# Patient Record
Sex: Male | Born: 1940 | Race: White | Hispanic: No | Smoking: Current every day smoker
Health system: Southern US, Community
[De-identification: ages and names within clinical notes are randomized; demographics above are authoritative.]

## PROBLEM LIST (undated history)

## (undated) DIAGNOSIS — K219 Gastro-esophageal reflux disease without esophagitis: Secondary | ICD-10-CM

## (undated) DIAGNOSIS — E039 Hypothyroidism, unspecified: Secondary | ICD-10-CM

## (undated) DIAGNOSIS — J449 Chronic obstructive pulmonary disease, unspecified: Secondary | ICD-10-CM

## (undated) HISTORY — PX: TONSILLECTOMY: SUR1361

---

## 1998-06-26 ENCOUNTER — Emergency Department (HOSPITAL_COMMUNITY): Admission: EM | Admit: 1998-06-26 | Discharge: 1998-06-26 | Payer: Self-pay | Admitting: Emergency Medicine

## 1999-05-14 ENCOUNTER — Ambulatory Visit (HOSPITAL_COMMUNITY): Admission: RE | Admit: 1999-05-14 | Discharge: 1999-05-14 | Payer: Self-pay | Admitting: Endocrinology

## 2015-12-30 DIAGNOSIS — J449 Chronic obstructive pulmonary disease, unspecified: Secondary | ICD-10-CM | POA: Diagnosis not present

## 2016-01-30 DIAGNOSIS — J449 Chronic obstructive pulmonary disease, unspecified: Secondary | ICD-10-CM | POA: Diagnosis not present

## 2016-02-27 DIAGNOSIS — J449 Chronic obstructive pulmonary disease, unspecified: Secondary | ICD-10-CM | POA: Diagnosis not present

## 2016-03-29 DIAGNOSIS — J449 Chronic obstructive pulmonary disease, unspecified: Secondary | ICD-10-CM | POA: Diagnosis not present

## 2016-04-28 DIAGNOSIS — J449 Chronic obstructive pulmonary disease, unspecified: Secondary | ICD-10-CM | POA: Diagnosis not present

## 2016-05-12 DIAGNOSIS — E789 Disorder of lipoprotein metabolism, unspecified: Secondary | ICD-10-CM | POA: Diagnosis not present

## 2016-05-12 DIAGNOSIS — E039 Hypothyroidism, unspecified: Secondary | ICD-10-CM | POA: Diagnosis not present

## 2016-05-12 DIAGNOSIS — Z125 Encounter for screening for malignant neoplasm of prostate: Secondary | ICD-10-CM | POA: Diagnosis not present

## 2016-05-12 DIAGNOSIS — Z79899 Other long term (current) drug therapy: Secondary | ICD-10-CM | POA: Diagnosis not present

## 2016-05-20 DIAGNOSIS — E789 Disorder of lipoprotein metabolism, unspecified: Secondary | ICD-10-CM | POA: Diagnosis not present

## 2016-05-20 DIAGNOSIS — E032 Hypothyroidism due to medicaments and other exogenous substances: Secondary | ICD-10-CM | POA: Diagnosis not present

## 2016-05-20 DIAGNOSIS — F17200 Nicotine dependence, unspecified, uncomplicated: Secondary | ICD-10-CM | POA: Diagnosis not present

## 2016-05-20 DIAGNOSIS — R5383 Other fatigue: Secondary | ICD-10-CM | POA: Diagnosis not present

## 2016-05-29 DIAGNOSIS — J449 Chronic obstructive pulmonary disease, unspecified: Secondary | ICD-10-CM | POA: Diagnosis not present

## 2016-06-28 DIAGNOSIS — J449 Chronic obstructive pulmonary disease, unspecified: Secondary | ICD-10-CM | POA: Diagnosis not present

## 2016-07-08 DIAGNOSIS — E039 Hypothyroidism, unspecified: Secondary | ICD-10-CM | POA: Diagnosis not present

## 2016-07-08 DIAGNOSIS — F17200 Nicotine dependence, unspecified, uncomplicated: Secondary | ICD-10-CM | POA: Diagnosis not present

## 2016-07-15 DIAGNOSIS — H2511 Age-related nuclear cataract, right eye: Secondary | ICD-10-CM | POA: Diagnosis not present

## 2016-07-15 DIAGNOSIS — H25812 Combined forms of age-related cataract, left eye: Secondary | ICD-10-CM | POA: Diagnosis not present

## 2016-07-15 DIAGNOSIS — E032 Hypothyroidism due to medicaments and other exogenous substances: Secondary | ICD-10-CM | POA: Diagnosis not present

## 2016-07-15 DIAGNOSIS — H5703 Miosis: Secondary | ICD-10-CM | POA: Diagnosis not present

## 2016-07-15 DIAGNOSIS — E789 Disorder of lipoprotein metabolism, unspecified: Secondary | ICD-10-CM | POA: Diagnosis not present

## 2016-07-29 DIAGNOSIS — J449 Chronic obstructive pulmonary disease, unspecified: Secondary | ICD-10-CM | POA: Diagnosis not present

## 2016-08-29 DIAGNOSIS — J449 Chronic obstructive pulmonary disease, unspecified: Secondary | ICD-10-CM | POA: Diagnosis not present

## 2016-09-28 DIAGNOSIS — J449 Chronic obstructive pulmonary disease, unspecified: Secondary | ICD-10-CM | POA: Diagnosis not present

## 2016-10-29 DIAGNOSIS — J449 Chronic obstructive pulmonary disease, unspecified: Secondary | ICD-10-CM | POA: Diagnosis not present

## 2016-11-28 DIAGNOSIS — J449 Chronic obstructive pulmonary disease, unspecified: Secondary | ICD-10-CM | POA: Diagnosis not present

## 2016-12-29 DIAGNOSIS — J449 Chronic obstructive pulmonary disease, unspecified: Secondary | ICD-10-CM | POA: Diagnosis not present

## 2017-01-10 DIAGNOSIS — E789 Disorder of lipoprotein metabolism, unspecified: Secondary | ICD-10-CM | POA: Diagnosis not present

## 2017-01-10 DIAGNOSIS — E039 Hypothyroidism, unspecified: Secondary | ICD-10-CM | POA: Diagnosis not present

## 2017-01-17 DIAGNOSIS — E032 Hypothyroidism due to medicaments and other exogenous substances: Secondary | ICD-10-CM | POA: Diagnosis not present

## 2017-01-17 DIAGNOSIS — R634 Abnormal weight loss: Secondary | ICD-10-CM | POA: Diagnosis not present

## 2017-01-17 DIAGNOSIS — E789 Disorder of lipoprotein metabolism, unspecified: Secondary | ICD-10-CM | POA: Diagnosis not present

## 2017-01-29 DIAGNOSIS — J449 Chronic obstructive pulmonary disease, unspecified: Secondary | ICD-10-CM | POA: Diagnosis not present

## 2017-02-26 DIAGNOSIS — J449 Chronic obstructive pulmonary disease, unspecified: Secondary | ICD-10-CM | POA: Diagnosis not present

## 2017-03-29 DIAGNOSIS — J449 Chronic obstructive pulmonary disease, unspecified: Secondary | ICD-10-CM | POA: Diagnosis not present

## 2017-04-26 DIAGNOSIS — M719 Bursopathy, unspecified: Secondary | ICD-10-CM | POA: Diagnosis not present

## 2017-04-28 DIAGNOSIS — J449 Chronic obstructive pulmonary disease, unspecified: Secondary | ICD-10-CM | POA: Diagnosis not present

## 2017-05-16 DIAGNOSIS — E789 Disorder of lipoprotein metabolism, unspecified: Secondary | ICD-10-CM | POA: Diagnosis not present

## 2017-05-16 DIAGNOSIS — E039 Hypothyroidism, unspecified: Secondary | ICD-10-CM | POA: Diagnosis not present

## 2017-05-16 DIAGNOSIS — Z125 Encounter for screening for malignant neoplasm of prostate: Secondary | ICD-10-CM | POA: Diagnosis not present

## 2017-05-16 DIAGNOSIS — Z0000 Encounter for general adult medical examination without abnormal findings: Secondary | ICD-10-CM | POA: Diagnosis not present

## 2017-05-23 DIAGNOSIS — E789 Disorder of lipoprotein metabolism, unspecified: Secondary | ICD-10-CM | POA: Diagnosis not present

## 2017-05-23 DIAGNOSIS — R828 Abnormal findings on cytological and histological examination of urine: Secondary | ICD-10-CM | POA: Diagnosis not present

## 2017-05-23 DIAGNOSIS — Z79899 Other long term (current) drug therapy: Secondary | ICD-10-CM | POA: Diagnosis not present

## 2017-05-23 DIAGNOSIS — F17200 Nicotine dependence, unspecified, uncomplicated: Secondary | ICD-10-CM | POA: Diagnosis not present

## 2017-05-25 DIAGNOSIS — R319 Hematuria, unspecified: Secondary | ICD-10-CM | POA: Diagnosis not present

## 2017-05-25 DIAGNOSIS — B349 Viral infection, unspecified: Secondary | ICD-10-CM | POA: Diagnosis not present

## 2017-05-29 DIAGNOSIS — J449 Chronic obstructive pulmonary disease, unspecified: Secondary | ICD-10-CM | POA: Diagnosis not present

## 2017-05-31 ENCOUNTER — Other Ambulatory Visit: Payer: Self-pay | Admitting: Gastroenterology

## 2017-05-31 DIAGNOSIS — Z86010 Personal history of colonic polyps: Secondary | ICD-10-CM | POA: Diagnosis not present

## 2017-05-31 DIAGNOSIS — K219 Gastro-esophageal reflux disease without esophagitis: Secondary | ICD-10-CM | POA: Diagnosis not present

## 2017-05-31 DIAGNOSIS — R079 Chest pain, unspecified: Secondary | ICD-10-CM | POA: Diagnosis not present

## 2017-05-31 DIAGNOSIS — R634 Abnormal weight loss: Secondary | ICD-10-CM | POA: Diagnosis not present

## 2017-06-28 DIAGNOSIS — J449 Chronic obstructive pulmonary disease, unspecified: Secondary | ICD-10-CM | POA: Diagnosis not present

## 2017-07-04 DIAGNOSIS — E039 Hypothyroidism, unspecified: Secondary | ICD-10-CM | POA: Diagnosis not present

## 2017-07-28 ENCOUNTER — Encounter (HOSPITAL_COMMUNITY)
Admission: RE | Disposition: A | Discharge status: Home / Self Care | Payer: Self-pay | Source: Ambulatory Visit | Attending: Gastroenterology

## 2017-07-28 ENCOUNTER — Ambulatory Visit (HOSPITAL_COMMUNITY): Payer: PPO | Admitting: Anesthesiology

## 2017-07-28 ENCOUNTER — Encounter (HOSPITAL_COMMUNITY): Payer: Self-pay | Admitting: *Deleted

## 2017-07-28 ENCOUNTER — Ambulatory Visit (HOSPITAL_COMMUNITY)
Admission: RE | Admit: 2017-07-28 | Discharge: 2017-07-28 | Disposition: A | Discharge status: Home / Self Care | Payer: PPO | Source: Ambulatory Visit | Attending: Gastroenterology | Admitting: Gastroenterology

## 2017-07-28 DIAGNOSIS — K2950 Unspecified chronic gastritis without bleeding: Secondary | ICD-10-CM | POA: Insufficient documentation

## 2017-07-28 DIAGNOSIS — K3189 Other diseases of stomach and duodenum: Secondary | ICD-10-CM | POA: Insufficient documentation

## 2017-07-28 DIAGNOSIS — Z86010 Personal history of colonic polyps: Secondary | ICD-10-CM | POA: Diagnosis not present

## 2017-07-28 DIAGNOSIS — Z9889 Other specified postprocedural states: Secondary | ICD-10-CM | POA: Diagnosis not present

## 2017-07-28 DIAGNOSIS — J449 Chronic obstructive pulmonary disease, unspecified: Secondary | ICD-10-CM | POA: Insufficient documentation

## 2017-07-28 DIAGNOSIS — Z1211 Encounter for screening for malignant neoplasm of colon: Secondary | ICD-10-CM | POA: Insufficient documentation

## 2017-07-28 DIAGNOSIS — K621 Rectal polyp: Secondary | ICD-10-CM | POA: Diagnosis not present

## 2017-07-28 DIAGNOSIS — D123 Benign neoplasm of transverse colon: Secondary | ICD-10-CM | POA: Diagnosis not present

## 2017-07-28 DIAGNOSIS — K219 Gastro-esophageal reflux disease without esophagitis: Secondary | ICD-10-CM | POA: Diagnosis not present

## 2017-07-28 DIAGNOSIS — K635 Polyp of colon: Secondary | ICD-10-CM | POA: Diagnosis not present

## 2017-07-28 DIAGNOSIS — E039 Hypothyroidism, unspecified: Secondary | ICD-10-CM | POA: Diagnosis not present

## 2017-07-28 DIAGNOSIS — F17210 Nicotine dependence, cigarettes, uncomplicated: Secondary | ICD-10-CM | POA: Insufficient documentation

## 2017-07-28 DIAGNOSIS — D124 Benign neoplasm of descending colon: Secondary | ICD-10-CM | POA: Diagnosis not present

## 2017-07-28 DIAGNOSIS — B9681 Helicobacter pylori [H. pylori] as the cause of diseases classified elsewhere: Secondary | ICD-10-CM | POA: Diagnosis not present

## 2017-07-28 DIAGNOSIS — K2970 Gastritis, unspecified, without bleeding: Secondary | ICD-10-CM | POA: Diagnosis not present

## 2017-07-28 HISTORY — PX: COLONOSCOPY WITH PROPOFOL: SHX5780

## 2017-07-28 HISTORY — DX: Gastro-esophageal reflux disease without esophagitis: K21.9

## 2017-07-28 HISTORY — PX: ESOPHAGOGASTRODUODENOSCOPY (EGD) WITH PROPOFOL: SHX5813

## 2017-07-28 HISTORY — DX: Chronic obstructive pulmonary disease, unspecified: J44.9

## 2017-07-28 HISTORY — DX: Hypothyroidism, unspecified: E03.9

## 2017-07-28 SURGERY — ESOPHAGOGASTRODUODENOSCOPY (EGD) WITH PROPOFOL
Anesthesia: Monitor Anesthesia Care

## 2017-07-28 MED ORDER — PROPOFOL 10 MG/ML IV BOLUS
INTRAVENOUS | Status: DC | PRN
  Administered 2017-07-28 (×17): 20 mg via INTRAVENOUS

## 2017-07-28 MED ORDER — PROPOFOL 10 MG/ML IV BOLUS
INTRAVENOUS | Status: AC
  Filled 2017-07-28: qty 40

## 2017-07-28 MED ORDER — LACTATED RINGERS IV SOLN
INTRAVENOUS | Status: DC
  Administered 2017-07-28: 09:00:00 via INTRAVENOUS

## 2017-07-28 MED ORDER — SODIUM CHLORIDE 0.9 % IV SOLN: INTRAVENOUS | Status: DC

## 2017-07-28 SURGICAL SUPPLY — 25 items

## 2017-07-28 NOTE — Op Note (Signed)
[facility] Patient Name: Edward Matthews Procedure Date: 07/28/2017 MRN: [id number] Attending MD: Carol Ada , MD Date of Birth: [birth year] CSN: [id number] Age: 76 Admit Type: Outpatient Procedure:                Colonoscopy Indications:              High risk colon cancer surveillance: Personal                            history of colonic polyps Providers:                Carol Ada, MD, Laverta Baltimore RN, RN, Alan Mulder, Technician Referring MD:              Medicines:                Propofol per Anesthesia Complications:            No immediate complications. Estimated Blood Loss:     Estimated blood loss was minimal. Procedure:                Pre-Anesthesia Assessment:                           - Prior to the procedure, a History and Physical                            was performed, and patient medications and                            allergies were reviewed. The patient's tolerance of                            previous anesthesia was also reviewed. The risks                            and benefits of the procedure and the sedation                            options and risks were discussed with the patient.                            All questions were answered, and informed consent                            was obtained. Prior Anticoagulants: The patient has                            taken no previous anticoagulant or antiplatelet                            agents. ASA Grade Assessment: III - A patient with  severe systemic disease. After reviewing the risks                            and benefits, the patient was deemed in                            satisfactory condition to undergo the procedure.                           - Sedation was administered by an anesthesia                            professional. Deep sedation was attained.                           After obtaining informed  consent, the colonoscope                            was passed under direct vision. Throughout the                            procedure, the patient's blood pressure, pulse, and                            oxygen saturations were monitored continuously. The                            EC-[id number] ([id number]) scope was introduced through                            the anus and advanced to the the cecum, identified                            by appendiceal orifice and ileocecal valve. The                            colonoscopy was somewhat difficult due to                            significant looping. Successful completion of the                            procedure was aided by applying abdominal pressure.                            The patient tolerated the procedure well. The                            quality of the bowel preparation was good. The                            ileocecal valve, appendiceal orifice, and rectum  were photographed. Scope In: 9:12:29 AM Scope Out: 9:40:24 AM Scope Withdrawal Time: 0 hours 12 minutes 34 seconds  Total Procedure Duration: 0 hours 27 minutes 55 seconds  Findings:      Two sessile polyps were found in the rectum and transverse colon. The       polyps were 7 to 10 mm in size. These polyps were removed with a hot       snare. Resection and retrieval were complete.      Three sessile polyps were found in the descending colon. The polyps were       3 to 4 mm in size. These polyps were removed with a cold snare.       Resection and retrieval were complete.      Technical issues precluded images of the cecum and a couple of other       polyps. Impression:               - Two 7 to 10 mm polyps in the rectum and in the                            transverse colon, removed with a hot snare.                            Resected and retrieved.                           - Three 3 to 4 mm polyps in the descending colon,                             removed with a cold snare. Resected and retrieved. Moderate Sedation:      N/A- Per Anesthesia Care Recommendation:           - Patient has a contact number available for                            emergencies. The signs and symptoms of potential                            delayed complications were discussed with the                            patient. Return to normal activities tomorrow.                            Written discharge instructions were provided to the                            patient.                           - Resume previous diet.                           - Continue present medications.                           - Await pathology results.                           -  Repeat colonoscopy in 3 years for surveillance. Procedure Code(s):        --- Professional ---                           [phone], Colonoscopy, flexible; with removal of                            tumor(s), polyp(s), or other lesion(s) by snare                            technique Diagnosis Code(s):        --- Professional ---                           K62.1, Rectal polyp                           Z86.010, Personal history of colonic polyps                           D12.3, Benign neoplasm of transverse colon (hepatic                            flexure or splenic flexure)                           D12.4, Benign neoplasm of descending colon CPT copyright 2016 [employer]. All rights reserved. The codes documented in this report are preliminary and upon coder review may  be revised to meet current compliance requirements. Carol Ada, MD Carol Ada, MD 07/28/2017 9:50:49 AM This report has been signed electronically. Number of Addenda: 0

## 2017-07-28 NOTE — Op Note (Signed)
[facility] Patient Name: Edward Matthews Procedure Date: 07/28/2017 MRN: [id number] Attending MD: Carol Ada , MD Date of Birth: 07-19-1941 CSN: [id number] Age: 76 Admit Type: Outpatient Procedure:                Upper GI endoscopy Indications:              Heartburn Providers:                Carol Ada, MD, Laverta Baltimore RN, RN, Alan Mulder, Technician Referring MD:              Medicines:                Propofol per Anesthesia Complications:            No immediate complications. Estimated Blood Loss:     Estimated blood loss: none. Estimated blood loss                            was minimal. Procedure:                Pre-Anesthesia Assessment:                           - Prior to the procedure, a History and Physical                            was performed, and patient medications and                            allergies were reviewed. The patient's tolerance of                            previous anesthesia was also reviewed. The risks                            and benefits of the procedure and the sedation                            options and risks were discussed with the patient.                            All questions were answered, and informed consent                            was obtained. Prior Anticoagulants: The patient has                            taken no previous anticoagulant or antiplatelet                            agents. ASA Grade Assessment: III - A patient with                            severe systemic disease.  After reviewing the risks                            and benefits, the patient was deemed in                            satisfactory condition to undergo the procedure.                           - Sedation was administered by an anesthesia                            professional. Deep sedation was attained.                           After obtaining informed consent, the endoscope was                       passed under direct vision. Throughout the                            procedure, the patient's blood pressure, pulse, and                            oxygen saturations were monitored continuously. The                            EC-3490LI ([id number]) scope was introduced through                            the mouth, and advanced to the second part of                            duodenum. The upper GI endoscopy was accomplished                            without difficulty. The patient tolerated the                            procedure well. Scope In: Scope Out: Findings:      The esophagus was normal.      Diffuse nodular mucosa was found in the gastric antrum. ? Intestinal       metaplasia. Biopsies were taken with a cold forceps for histology.      The examined duodenum was normal. Impression:               - Normal esophagus.                           - Nodular mucosa in the gastric antrum. Biopsied.                           - Normal examined duodenum. Moderate Sedation:      N/A- Per Anesthesia Care Recommendation:           - Patient has a contact number available for  emergencies. The signs and symptoms of potential                            delayed complications were discussed with the                            patient. Return to normal activities tomorrow.                            Written discharge instructions were provided to the                            patient.                           - Resume previous diet.                           - Continue present medications.                           - Await pathology results. Procedure Code(s):        --- Professional ---                           [id number], Esophagogastroduodenoscopy, flexible,                            transoral; with biopsy, single or multiple Diagnosis Code(s):        --- Professional ---                           K31.89, Other diseases of stomach and duodenum                            R12, Heartburn CPT copyright 2016 American [employer] Association. All rights reserved. The codes documented in this report are preliminary and upon coder review may  be revised to meet current compliance requirements. Carol Ada, MD Carol Ada, MD 07/28/2017 9:53:26 AM This report has been signed electronically. Number of Addenda: 0

## 2017-07-28 NOTE — H&P (Signed)
  Edward Matthews HPI: His colonoscopy on 04/07/2011 was significant for several large adenomas, measuring 1 cm. A 3 year follow up was recommended, but he did not follow up. The patient reports issues with reflux and chest pressure when he lies down, but in the daytime he will also have some problems. He does state that intermittently, but no often, he will have some chest pressure with walking up a flight of stairs. No reports of melena, but he states that he has hemorrhoidal bleeding intermittently. Every evening he uses oxygen and he is trying to quit. His wife is in hospice currently with lung cancer. Recently he was told by Dr. Wilson Singer that his weight dropped down by 19 lbs.   Past Medical History:  Diagnosis Date  . COPD (chronic obstructive pulmonary disease) ([place])   . GERD (gastroesophageal reflux disease)   . Hypothyroidism     Past Surgical History:  Procedure Laterality Date  . TONSILLECTOMY      History reviewed. No pertinent family history.  Social History:  reports that he has been smoking Cigarettes.  He has a 31.50 pack-year smoking history. He has never used smokeless tobacco. His alcohol and drug histories are not on file.  Allergies: No Known Allergies  Medications:  Scheduled:  Continuous: . lactated ringers 20 mL/hr at 07/28/17 0831    No results found for this or any previous visit (from the past 24 hour(s)).   No results found.  ROS:  As stated above in the HPI otherwise negative.  Blood pressure (!) 179/59, pulse (!) 52, temperature 97.6 F (36.4 C), temperature source Oral, resp. rate (!) 21, height 5\' 9"  (1.753 m), weight 59.9 kg (132 lb), SpO2 94 %.    PE: Gen: NAD, Alert and Oriented HEENT:  [place]/AT, EOMI Neck: Supple, no LAD Lungs: CTA Bilaterally CV: RRR without M/G/R ABM: Soft, NTND, +BS Ext: No C/C/E  Assessment/Plan: 1) Personal history of polyps - Repeat colonoscopy. 2) GERD.   Mollye Guinta D 07/28/2017, 8:50 AM

## 2017-07-28 NOTE — Anesthesia Postprocedure Evaluation (Signed)
Anesthesia Post Note  Patient: Edward Matthews  Procedure(s) Performed: Procedure(s) (LRB): ESOPHAGOGASTRODUODENOSCOPY (EGD) WITH PROPOFOL (N/A) COLONOSCOPY WITH PROPOFOL (N/A)     Patient location during evaluation: Endoscopy Anesthesia Type: MAC Level of consciousness: awake and alert Pain management: pain level controlled Vital Signs Assessment: post-procedure vital signs reviewed and stable Respiratory status: spontaneous breathing, nonlabored ventilation, respiratory function stable and patient connected to nasal cannula oxygen Cardiovascular status: stable and blood pressure returned to baseline Anesthetic complications: no    Last Vitals:  Vitals:   07/28/17 0954 07/28/17 1001  BP: 112/60 (!) 164/62  Pulse: 69 (!) 50  Resp: 15 17  Temp:      Last Pain:  Vitals:   07/28/17 0827  TempSrc: Oral                 [name]

## 2017-07-28 NOTE — Anesthesia Preprocedure Evaluation (Signed)
Anesthesia Evaluation  Patient identified by MRN, date of birth, ID band Patient awake    Reviewed: Allergy & Precautions, NPO status , Patient's Chart, lab work & pertinent test results  Airway Mallampati: II  TM Distance: >3 FB Neck ROM: Full    Dental no notable dental hx.    Pulmonary COPD, Current Smoker,    Pulmonary exam normal breath sounds clear to auscultation       Cardiovascular negative cardio ROS Normal cardiovascular exam Rhythm:Regular Rate:Normal     Neuro/Psych negative neurological ROS  negative psych ROS   GI/Hepatic negative GI ROS, Neg liver ROS,   Endo/Other  Hypothyroidism   Renal/GU negative Renal ROS  negative genitourinary   Musculoskeletal negative musculoskeletal ROS (+)   Abdominal   Peds negative pediatric ROS (+)  Hematology negative hematology ROS (+)   Anesthesia Other Findings   Reproductive/Obstetrics negative OB ROS                             Anesthesia Physical Anesthesia Plan  ASA: III  Anesthesia Plan: MAC   Post-op Pain Management:    Induction:   PONV Risk Score and Plan:   Airway Management Planned: Nasal Cannula  Additional Equipment:   Intra-op Plan:   Post-operative Plan:   Informed Consent: I have reviewed the patients History and Physical, chart, labs and discussed the procedure including the risks, benefits and alternatives for the proposed anesthesia with the patient or authorized representative who has indicated his/her understanding and acceptance.   Dental advisory given  Plan Discussed with: CRNA  Anesthesia Plan Comments:         Anesthesia Quick Evaluation

## 2017-07-28 NOTE — Transfer of Care (Signed)
Immediate Anesthesia Transfer of Care Note  Patient: Deverick Pruss Aikens  Procedure(s) Performed: Procedure(s): ESOPHAGOGASTRODUODENOSCOPY (EGD) WITH PROPOFOL (N/A) COLONOSCOPY WITH PROPOFOL (N/A)  Patient Location: PACU  Anesthesia Type:MAC  Level of Consciousness: sedated  Airway & Oxygen Therapy: Patient Spontanous Breathing and Patient connected to nasal cannula oxygen  Post-op Assessment: Report given to RN and Post -op Vital signs reviewed and stable  Post vital signs: Reviewed and stable  Last Vitals:  Vitals:   07/28/17 0827  BP: (!) 179/59  Pulse: (!) 52  Resp: (!) 21  Temp: 36.4 C    Last Pain:  Vitals:   07/28/17 0827  TempSrc: Oral         Complications: No apparent anesthesia complications

## 2017-07-28 NOTE — Discharge Instructions (Signed)
Esophagogastroduodenoscopy, Care After °Refer to this sheet in the next few weeks. These instructions provide you with information about caring for yourself after your procedure. Your health care provider may also give you more specific instructions. Your treatment has been planned according to current medical practices, but problems sometimes occur. Call your health care provider if you have any problems or questions after your procedure. °What can I expect after the procedure? °After the procedure, it is common to have: °· A sore throat. °· Nausea. °· Bloating. °· Dizziness. °· Fatigue. ° °Follow these instructions at home: °· Do not eat or drink anything until the numbing medicine (local anesthetic) has worn off and your gag reflex has returned. You will know that the local anesthetic has worn off when you can swallow comfortably. °· Do not drive for 24 hours if you received a medicine to help you relax (sedative). °· If your health care provider took a tissue sample for testing during the procedure, make sure to get your test results. This is your responsibility. Ask your health care provider or the department performing the test when your results will be ready. °· Keep all follow-up visits as told by your health care provider. This is important. °Contact a health care provider if: °· You cannot stop coughing. °· You are not urinating. °· You are urinating less than usual. °Get help right away if: °· You have trouble swallowing. °· You cannot eat or drink. °· You have throat or chest pain that gets worse. °· You are dizzy or light-headed. °· You faint. °· You have nausea or vomiting. °· You have chills. °· You have a fever. °· You have severe abdominal pain. °· You have black, tarry, or bloody stools. °This information is not intended to replace advice given to you by your health care provider. Make sure you discuss any questions you have with your health care provider. °Document Released: 11/28/2012 Document  Revised: 05/19/2016 Document Reviewed: 11/05/2015 °Elsevier Interactive Patient Education © 2018 [employer] °Colonoscopy, Adult, Care After °This sheet gives you information about how to care for yourself after your procedure. Your doctor may also give you more specific instructions. If you have problems or questions, call your doctor. °Follow these instructions at home: °General instructions ° °· For the first 24 hours after the procedure: °? Do not drive or use machinery. °? Do not sign important documents. °? Do not drink alcohol. °? Do your daily activities more slowly than normal. °? Eat foods that are soft and easy to digest. °? Rest often. °· Take over-the-counter or prescription medicines only as told by your doctor. °· It is up to you to get the results of your procedure. Ask your doctor, or the department performing the procedure, when your results will be ready. °To help cramping and bloating: °· Try walking around. °· Put heat on your belly (abdomen) as told by your doctor. Use a heat source that your doctor recommends, such as a moist heat pack or a heating pad. °? Put a towel between your skin and the heat source. °? Leave the heat on for 20-30 minutes. °? Remove the heat if your skin turns bright red. This is especially important if you cannot feel pain, heat, or cold. You can get burned. °Eating and drinking °· Drink enough fluid to keep your pee (urine) clear or pale yellow. °· Return to your normal diet as told by your doctor. Avoid heavy or fried foods that are hard to digest. °· Avoid   drinking alcohol for as long as told by your doctor. °Contact a doctor if: °· You have blood in your poop (stool) 2-3 days after the procedure. °Get help right away if: °· You have more than a small amount of blood in your poop. °· You see large clumps of tissue (blood clots) in your poop. °· Your belly is swollen. °· You feel sick to your stomach (nauseous). °· You throw up (vomit). °· You have a fever. °· You  have belly pain that gets worse, and medicine does not help your pain. °This information is not intended to replace advice given to you by your health care provider. Make sure you discuss any questions you have with your health care provider. °Document Released: 01/14/2011 Document Revised: 09/05/2016 Document Reviewed: 09/05/2016 °Elsevier Interactive Patient Education © 2017 [employer] ° °

## 2017-07-29 DIAGNOSIS — J449 Chronic obstructive pulmonary disease, unspecified: Secondary | ICD-10-CM | POA: Diagnosis not present

## 2017-08-29 DIAGNOSIS — J449 Chronic obstructive pulmonary disease, unspecified: Secondary | ICD-10-CM | POA: Diagnosis not present

## 2017-09-11 DIAGNOSIS — A048 Other specified bacterial intestinal infections: Secondary | ICD-10-CM | POA: Diagnosis not present

## 2017-09-11 DIAGNOSIS — Z86010 Personal history of colonic polyps: Secondary | ICD-10-CM | POA: Diagnosis not present

## 2017-09-28 DIAGNOSIS — J449 Chronic obstructive pulmonary disease, unspecified: Secondary | ICD-10-CM | POA: Diagnosis not present

## 2017-10-29 DIAGNOSIS — J449 Chronic obstructive pulmonary disease, unspecified: Secondary | ICD-10-CM | POA: Diagnosis not present

## 2017-11-28 DIAGNOSIS — J449 Chronic obstructive pulmonary disease, unspecified: Secondary | ICD-10-CM | POA: Diagnosis not present

## 2017-11-30 DIAGNOSIS — E039 Hypothyroidism, unspecified: Secondary | ICD-10-CM | POA: Diagnosis not present

## 2017-11-30 DIAGNOSIS — E7800 Pure hypercholesterolemia, unspecified: Secondary | ICD-10-CM | POA: Diagnosis not present

## 2017-11-30 DIAGNOSIS — J449 Chronic obstructive pulmonary disease, unspecified: Secondary | ICD-10-CM | POA: Diagnosis not present

## 2017-11-30 DIAGNOSIS — R634 Abnormal weight loss: Secondary | ICD-10-CM | POA: Diagnosis not present

## 2017-11-30 DIAGNOSIS — Z720 Tobacco use: Secondary | ICD-10-CM | POA: Diagnosis not present

## 2017-12-11 DIAGNOSIS — E7800 Pure hypercholesterolemia, unspecified: Secondary | ICD-10-CM | POA: Diagnosis not present

## 2017-12-11 DIAGNOSIS — E039 Hypothyroidism, unspecified: Secondary | ICD-10-CM | POA: Diagnosis not present

## 2017-12-29 DIAGNOSIS — J449 Chronic obstructive pulmonary disease, unspecified: Secondary | ICD-10-CM | POA: Diagnosis not present

## 2018-01-10 DIAGNOSIS — J849 Interstitial pulmonary disease, unspecified: Secondary | ICD-10-CM | POA: Diagnosis not present

## 2018-01-10 DIAGNOSIS — K219 Gastro-esophageal reflux disease without esophagitis: Secondary | ICD-10-CM | POA: Diagnosis not present

## 2018-01-10 DIAGNOSIS — F17200 Nicotine dependence, unspecified, uncomplicated: Secondary | ICD-10-CM | POA: Diagnosis not present

## 2018-01-10 DIAGNOSIS — Z0000 Encounter for general adult medical examination without abnormal findings: Secondary | ICD-10-CM | POA: Diagnosis not present

## 2018-01-10 DIAGNOSIS — E7800 Pure hypercholesterolemia, unspecified: Secondary | ICD-10-CM | POA: Diagnosis not present

## 2018-01-10 DIAGNOSIS — E039 Hypothyroidism, unspecified: Secondary | ICD-10-CM | POA: Diagnosis not present

## 2018-01-10 DIAGNOSIS — J449 Chronic obstructive pulmonary disease, unspecified: Secondary | ICD-10-CM | POA: Diagnosis not present

## 2018-01-17 ENCOUNTER — Observation Stay (HOSPITAL_COMMUNITY)
Admission: EM | Admit: 2018-01-17 | Discharge: 2018-01-18 | Disposition: A | Discharge status: Home / Self Care | Payer: PPO | Attending: Internal Medicine | Admitting: Internal Medicine

## 2018-01-17 ENCOUNTER — Emergency Department (HOSPITAL_COMMUNITY): Payer: PPO

## 2018-01-17 ENCOUNTER — Encounter (HOSPITAL_COMMUNITY): Payer: Self-pay | Admitting: Emergency Medicine

## 2018-01-17 ENCOUNTER — Other Ambulatory Visit: Payer: Self-pay

## 2018-01-17 DIAGNOSIS — F17210 Nicotine dependence, cigarettes, uncomplicated: Secondary | ICD-10-CM | POA: Diagnosis not present

## 2018-01-17 DIAGNOSIS — E7800 Pure hypercholesterolemia, unspecified: Secondary | ICD-10-CM | POA: Insufficient documentation

## 2018-01-17 DIAGNOSIS — H538 Other visual disturbances: Secondary | ICD-10-CM | POA: Diagnosis not present

## 2018-01-17 DIAGNOSIS — J449 Chronic obstructive pulmonary disease, unspecified: Secondary | ICD-10-CM | POA: Diagnosis not present

## 2018-01-17 DIAGNOSIS — E039 Hypothyroidism, unspecified: Secondary | ICD-10-CM | POA: Diagnosis not present

## 2018-01-17 DIAGNOSIS — K219 Gastro-esophageal reflux disease without esophagitis: Secondary | ICD-10-CM | POA: Diagnosis not present

## 2018-01-17 DIAGNOSIS — J9611 Chronic respiratory failure with hypoxia: Secondary | ICD-10-CM | POA: Diagnosis present

## 2018-01-17 DIAGNOSIS — R079 Chest pain, unspecified: Secondary | ICD-10-CM | POA: Diagnosis not present

## 2018-01-17 DIAGNOSIS — G9389 Other specified disorders of brain: Secondary | ICD-10-CM | POA: Diagnosis not present

## 2018-01-17 DIAGNOSIS — R072 Precordial pain: Principal | ICD-10-CM | POA: Insufficient documentation

## 2018-01-17 DIAGNOSIS — J439 Emphysema, unspecified: Secondary | ICD-10-CM | POA: Diagnosis not present

## 2018-01-17 DIAGNOSIS — Z720 Tobacco use: Secondary | ICD-10-CM

## 2018-01-17 LAB — CBC
HEMATOCRIT: 43 % (ref 39.0–52.0)
HEMOGLOBIN: 14.4 g/dL (ref 13.0–17.0)
MCH: 31 pg (ref 26.0–34.0)
MCHC: 33.5 g/dL (ref 30.0–36.0)
MCV: 92.7 fL (ref 78.0–100.0)
Platelets: 179 10*3/uL (ref 150–400)
RBC: 4.64 MIL/uL (ref 4.22–5.81)
RDW: 13.4 % (ref 11.5–15.5)
WBC: 9.5 10*3/uL (ref 4.0–10.5)

## 2018-01-17 LAB — URINALYSIS, ROUTINE W REFLEX MICROSCOPIC
BACTERIA UA: NONE SEEN
Bilirubin Urine: NEGATIVE
Glucose, UA: NEGATIVE mg/dL
KETONES UR: 20 mg/dL — AB
Leukocytes, UA: NEGATIVE
Nitrite: NEGATIVE
PH: 7 (ref 5.0–8.0)
Protein, ur: NEGATIVE mg/dL
SQUAMOUS EPITHELIAL / LPF: NONE SEEN
Specific Gravity, Urine: 1.009 (ref 1.005–1.030)

## 2018-01-17 LAB — HEPATIC FUNCTION PANEL
ALT: 14 U/L — AB (ref 17–63)
AST: 16 U/L (ref 15–41)
Albumin: 3.7 g/dL (ref 3.5–5.0)
Alkaline Phosphatase: 93 U/L (ref 38–126)
BILIRUBIN DIRECT: 0.2 mg/dL (ref 0.1–0.5)
Indirect Bilirubin: 0.4 mg/dL (ref 0.3–0.9)
TOTAL PROTEIN: 6.8 g/dL (ref 6.5–8.1)
Total Bilirubin: 0.6 mg/dL (ref 0.3–1.2)

## 2018-01-17 LAB — BASIC METABOLIC PANEL
Anion gap: 11 (ref 5–15)
BUN: 13 mg/dL (ref 6–20)
CHLORIDE: 107 mmol/L (ref 101–111)
CO2: 22 mmol/L (ref 22–32)
Calcium: 8.9 mg/dL (ref 8.9–10.3)
Creatinine, Ser: 0.95 mg/dL (ref 0.61–1.24)
GFR calc Af Amer: >60 mL/min (ref >60)
GFR calc non Af Amer: >60 mL/min (ref >60)
GLUCOSE: 105 mg/dL — AB (ref 65–99)
POTASSIUM: 3.9 mmol/L (ref 3.5–5.1)
Sodium: 140 mmol/L (ref 135–145)

## 2018-01-17 LAB — I-STAT TROPONIN, ED: TROPONIN I, POC: 0 ng/mL (ref 0.00–0.08)

## 2018-01-17 LAB — TSH: TSH: 0.511 u[IU]/mL (ref 0.350–4.500)

## 2018-01-17 LAB — LIPASE, BLOOD: LIPASE: 22 U/L (ref 11–51)

## 2018-01-17 LAB — TROPONIN I: Troponin I: <0.03 ng/mL (ref <0.03)

## 2018-01-17 LAB — MAGNESIUM: Magnesium: 2 mg/dL (ref 1.7–2.4)

## 2018-01-17 MED ORDER — FLUTICASONE FUROATE-VILANTEROL 200-25 MCG/INH IN AEPB
1.0000 | INHALATION_SPRAY | Freq: Every day | RESPIRATORY_TRACT | Status: DC
Start: 2018-01-18 — End: 2018-01-18
  Filled 2018-01-17: qty 28

## 2018-01-17 MED ORDER — NICOTINE 21 MG/24HR TD PT24
21.0000 mg | MEDICATED_PATCH | Freq: Every day | TRANSDERMAL | Status: DC
  Administered 2018-01-18: 21 mg via TRANSDERMAL
  Filled 2018-01-17: qty 1

## 2018-01-17 MED ORDER — ENOXAPARIN SODIUM 40 MG/0.4ML ~~LOC~~ SOLN
40.0000 mg | SUBCUTANEOUS | Status: DC
  Administered 2018-01-18: 40 mg via SUBCUTANEOUS
  Filled 2018-01-17 (×2): qty 0.4

## 2018-01-17 MED ORDER — ACETAMINOPHEN 325 MG PO TABS: 650.0000 mg | ORAL_TABLET | ORAL | Status: DC | PRN

## 2018-01-17 MED ORDER — PRAVASTATIN SODIUM 40 MG PO TABS
80.0000 mg | ORAL_TABLET | Freq: Every day | ORAL | Status: DC
  Administered 2018-01-18: 80 mg via ORAL
  Filled 2018-01-17: qty 2

## 2018-01-17 MED ORDER — PANTOPRAZOLE SODIUM 40 MG PO TBEC
40.0000 mg | DELAYED_RELEASE_TABLET | Freq: Every day | ORAL | Status: DC
  Administered 2018-01-18: 40 mg via ORAL
  Filled 2018-01-17: qty 1

## 2018-01-17 MED ORDER — SODIUM CHLORIDE 0.9 % IV BOLUS (SEPSIS)
500.0000 mL | Freq: Once | INTRAVENOUS | Status: AC
  Administered 2018-01-17: 500 mL via INTRAVENOUS

## 2018-01-17 MED ORDER — LEVOTHYROXINE SODIUM 125 MCG PO TABS: 125.0000 ug | ORAL_TABLET | Freq: Every day | ORAL | Status: DC

## 2018-01-17 MED ORDER — ONDANSETRON HCL 4 MG/2ML IJ SOLN: 4.0000 mg | Freq: Four times a day (QID) | INTRAMUSCULAR | Status: DC | PRN

## 2018-01-17 NOTE — ED Notes (Signed)
Patient transported to CT 

## 2018-01-17 NOTE — H&P (Signed)
History and Physical    Edward Matthews HEN:[id number] DOB: 1941-09-06 DOA: 01/17/2018  [name], MD  Patient coming from: Home  I have personally briefly reviewed patient's old medical records in [place]  Chief Complaint: CP, SOB, weakness  HPI: Edward Matthews is a 77 y.o. male with medical history significant of COPD emphysema, GERD.  Patient is still smoking 1 PPD.  Is on O2 at night for the past 1.5-2 years.  Presents to the ED with onset last evening was much worse with epigastric / substernal chest pain.  Diarrhea and intermittent blurry vision.  Last diarrhea was this AM.  Patient has been having intermittent CP for months, but reports that its really the severe SOB with any activity that gets to him.   ED Course: Trop neg.  EKG with possible ST depression in lead III.  Patient is satting 87% sitting on room air.  EDP thinks this could be coronary and wants CP r/o.   Review of Systems: As per HPI otherwise 10 point review of systems negative.   Past Medical History:  Diagnosis Date  . COPD (chronic obstructive pulmonary disease) ([place])   . GERD (gastroesophageal reflux disease)   . Hypothyroidism     Past Surgical History:  Procedure Laterality Date  . COLONOSCOPY WITH PROPOFOL N/A 07/28/2017   Procedure: COLONOSCOPY WITH PROPOFOL;  Surgeon: Carol Ada, MD;  Location: [facility];  Service: Endoscopy;  Laterality: N/A;  . ESOPHAGOGASTRODUODENOSCOPY (EGD) WITH PROPOFOL N/A 07/28/2017   Procedure: ESOPHAGOGASTRODUODENOSCOPY (EGD) WITH PROPOFOL;  Surgeon: Carol Ada, MD;  Location: [facility];  Service: Endoscopy;  Laterality: N/A;  . TONSILLECTOMY       reports that he has been smoking cigarettes.  He has a 31.50 pack-year smoking history. he has never used smokeless tobacco. His alcohol and drug histories are not on file.  No Known Allergies  No family history on file.   Prior to Admission medications   Medication Sig Start Date End  Date Taking? Authorizing Provider  ADVAIR DISKUS 250-50 MCG/DOSE AEPB Inhale 2 puffs into the lungs 2 (two) times daily.  05/30/17  Yes [provider]  dexlansoprazole (DEXILANT) 60 MG capsule Take 60 mg by mouth daily.   Yes [provider]  pravastatin (PRAVACHOL) 80 MG tablet Take 80 mg by mouth daily.   Yes [provider]  SYNTHROID 125 MCG tablet Take 125 mcg by mouth daily before breakfast.  05/25/17  Yes [provider]    Physical Exam: Vitals:   01/17/18 2015 01/17/18 2030 01/17/18 2145 01/17/18 2200  BP: (!) 145/94 (!) 163/77 (!) 154/76 (!) 175/94  Pulse: 71 65 (!) 58 63  Resp: 16 (!) 25 18 18   Temp:      TempSrc:      SpO2: 98% (!) 87% 92% 92%  Weight:      Height:        Constitutional: NAD, calm, comfortable Eyes: PERRL, lids and conjunctivae normal ENMT: Mucous membranes are moist. Posterior pharynx clear of any exudate or lesions.Normal dentition.  Neck: normal, supple, no masses, no thyromegaly Respiratory: clear to auscultation bilaterally, no wheezing, no crackles. Normal respiratory effort. No accessory muscle use.  Cardiovascular: Regular rate and rhythm, no murmurs / rubs / gallops. No extremity edema. 2+ pedal pulses. No carotid bruits.  Abdomen: no tenderness, no masses palpated. No hepatosplenomegaly. Bowel sounds positive.  Musculoskeletal: no clubbing / cyanosis. No joint deformity upper and lower extremities. Good ROM, no contractures. Normal muscle  tone.  Skin: no rashes, lesions, ulcers. No induration Neurologic: CN 2-12 grossly intact. Sensation intact, DTR normal. Strength 5/5 in all 4.  Psychiatric: Normal judgment and insight. Alert and oriented x 3. Normal mood.    Labs on Admission: I have personally reviewed following labs and imaging studies  CBC: Recent Labs  Lab 01/17/18 1758  WBC 9.5  HGB 14.4  HCT 43.0  MCV 92.7  PLT 161   Basic Metabolic Panel: Recent Labs  Lab 01/17/18 1758  NA 140  K 3.9    CL 107  CO2 22  GLUCOSE 105*  BUN 13  CREATININE 0.95  CALCIUM 8.9  MG 2.0   GFR: Estimated Creatinine Clearance: 60 mL/min (by C-G formula based on SCr of 0.95 mg/dL). Liver Function Tests: Recent Labs  Lab 01/17/18 1758  AST 16  ALT 14*  ALKPHOS 93  BILITOT 0.6  PROT 6.8  ALBUMIN 3.7   No results for input(s): LIPASE, AMYLASE in the last 168 hours. No results for input(s): AMMONIA in the last 168 hours. Coagulation Profile: No results for input(s): INR, PROTIME in the last 168 hours. Cardiac Enzymes: No results for input(s): CKTOTAL, CKMB, CKMBINDEX, TROPONINI in the last 168 hours. BNP (last 3 results) No results for input(s): PROBNP in the last 8760 hours. HbA1C: No results for input(s): HGBA1C in the last 72 hours. CBG: No results for input(s): GLUCAP in the last 168 hours. Lipid Profile: No results for input(s): CHOL, HDL, LDLCALC, TRIG, CHOLHDL, LDLDIRECT in the last 72 hours. Thyroid Function Tests: Recent Labs    01/17/18 1758  TSH 0.511   Anemia Panel: No results for input(s): VITAMINB12, FOLATE, FERRITIN, TIBC, IRON, RETICCTPCT in the last 72 hours. Urine analysis:    Component Value Date/Time   COLORURINE STRAW (A) 01/17/2018 1906   APPEARANCEUR CLEAR 01/17/2018 1906   LABSPEC 1.009 01/17/2018 1906   PHURINE 7.0 01/17/2018 1906   GLUCOSEU NEGATIVE 01/17/2018 1906   HGBUR SMALL (A) 01/17/2018 [place] NEGATIVE 01/17/2018 1906   KETONESUR 20 (A) 01/17/2018 1906   PROTEINUR NEGATIVE 01/17/2018 1906   NITRITE NEGATIVE 01/17/2018 1906   LEUKOCYTESUR NEGATIVE 01/17/2018 1906    Radiological Exams on Admission: Dg Chest 2 View  Result Date: 01/17/2018 CLINICAL DATA:  Epigastric pain with chest pain and diarrhea. History of COPD. EXAM: CHEST  2 VIEW COMPARISON:  05/23/2017 FINDINGS: Normal heart size with tortuous atherosclerotic, nonaneurysmal aorta. Mild pulmonary hyperinflation with stable slightly coarsened interstitial lung markings.  No alveolar consolidation or CHF. No effusion or pneumothorax. Mild degenerative change along the dorsal spine. IMPRESSION: Emphysematous hyperinflation of the lungs without active pulmonary disease. Aortic atherosclerosis. Electronically Signed   By: Ashley Royalty M.D.   On: 01/17/2018 18:51   Ct Head Wo Contrast  Result Date: 01/17/2018 CLINICAL DATA:  77 year old male with a history of blurry vision EXAM: CT HEAD WITHOUT CONTRAST TECHNIQUE: Contiguous axial images were obtained from the base of the skull through the vertex without intravenous contrast. COMPARISON:  None. FINDINGS: Brain: No acute intracranial hemorrhage. No midline shift or mass effect. Gray-white differentiation is maintained. Confluent hypodensity in the periventricular white matter. Unremarkable configuration the ventricles. Vascular: Dense calcifications of the anterior circulation. Minimal calcifications of the posterior circulation. Skull: No acute bony abnormality.  No aggressive bony lesions. Sinuses/Orbits: Opacification of the inferior recesses of the left maxillary sinus. Questionable involvement of the dentition of the left maxillary molar teeth Other: None IMPRESSION: CT is negative for acute intracranial abnormality. 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 Left maxillary sinus disease, potentially related to the left-sided maxillary dentition. Electronically Signed   By: Corrie Mckusick D.O.   On: 01/17/2018 18:43    EKG: Independently reviewed.  Assessment/Plan Principal Problem:   Chest pain Active Problems:   COPD (chronic obstructive pulmonary disease) ([facility])   Chronic respiratory failure with hypoxia ([facility])    1. Chest pain - 1. What I actually think is going on: Progressive DOE, and SOB due to progression of emphysema in a patient with known emphysema still smoking 1PPD.  Was put on O2 at night 1.5-2 years ago.  Now satting in upper 80s at rest in ED. (I think  he might need home O2). 2. However will put on CP obs pathway 3. Serial trops 4. Tele monitor 5. And put in for cards consult in AM 2. Chronic resp failrue with hypoxia - suspect progressive worsening of COPD is the root of his problems and presentation given HPI. 1. O2 via [place] especially with activity. 2. Should qualify for home O2 3. Nicotine patch 4. Discussed quitting smoking  DVT prophylaxis: Lovenox Code Status: Full Family Communication: Family at bedside Disposition Plan: Home after admit Consults called: Put msg in to P.Trent for cards eval in AM Admission status: Place in [place], [place] Hospitalists Pager [phone]  If 7AM-7PM, please contact day team taking care of patient [email or web address] Password [facility], [place]  01/17/2018, 10:19 PM

## 2018-01-17 NOTE — ED Provider Notes (Signed)
Emergency Department Provider Note   I have reviewed the triage vital signs and the nursing notes.   HISTORY  Chief Complaint Chest Pain; Diarrhea; Weakness; and Blurred Vision   HPI Edward Matthews is a 77 y.o. male with PMH of COPD, GERD, and Hypothyroidism presents to the emergency department for evaluation of blurry vision, chest pain, epigastric discomfort, diarrhea.  Symptoms have been intermittent and worsening over the day.  Patient states he has intermittent blurry vision but has been more severe today.  No double vision.  He had some diarrhea this morning with associated left-sided chest pain.  The chest pain he experienced was fleeting and sharp.  Since that time, however, he is developed lower chest/epigastric discomfort that is more constant.  No change with food or over-the-counter medications.  No radiation of symptoms.  Patient denies dyspnea or palpitations.  No known history of coronary artery disease.  He went to the pharmacy said he just was not feeling right having chest pain.  They took his blood pressure found to be elevated and referred him to the emergency department.    Past Medical History:  Diagnosis Date  . COPD (chronic obstructive pulmonary disease) ([place])   . GERD (gastroesophageal reflux disease)   . Hypothyroidism     Patient Active Problem List   Diagnosis Date Noted  . Chest pain 01/17/2018  . COPD (chronic obstructive pulmonary disease) ([place]) 01/17/2018  . Chronic respiratory failure with hypoxia ([place]) 01/17/2018    Past Surgical History:  Procedure Laterality Date  . COLONOSCOPY WITH PROPOFOL N/A 07/28/2017   Procedure: COLONOSCOPY WITH PROPOFOL;  Surgeon: Carol Ada, MD;  Location: [facility];  Service: Endoscopy;  Laterality: N/A;  . ESOPHAGOGASTRODUODENOSCOPY (EGD) WITH PROPOFOL N/A 07/28/2017   Procedure: ESOPHAGOGASTRODUODENOSCOPY (EGD) WITH PROPOFOL;  Surgeon: Carol Ada, MD;  Location: [facility];  Service: Endoscopy;   Laterality: N/A;  . TONSILLECTOMY        Allergies Patient has no known allergies.  No family history on file.  Social History Social History   Tobacco Use  . Smoking status: Current Every Day Smoker    Packs/day: 0.50    Years: 63.00    Pack years: 31.50    Types: Cigarettes  . Smokeless tobacco: Never Used  Substance Use Topics  . Alcohol use: Not on file  . Drug use: Not on file    Review of Systems  Constitutional: No fever/chills. Positive generalized fatigue.  Eyes: Positive visual changes. ENT: No sore throat. Cardiovascular: Positive chest pain. Respiratory: Denies shortness of breath. Gastrointestinal: Positive epigastric abdominal pain.  No nausea, no vomiting.  No diarrhea.  No constipation. Genitourinary: Negative for dysuria. Musculoskeletal: Negative for back pain. Skin: Negative for rash. Neurological: Negative for headaches, focal weakness or numbness.  10-point ROS otherwise negative.  ____________________________________________   PHYSICAL EXAM:  VITAL SIGNS: ED Triage Vitals  Enc Vitals Group     BP 01/17/18 1613 (!) 170/81     Pulse Rate 01/17/18 1613 (!) 58     Resp 01/17/18 1613 18     Temp 01/17/18 1613 98 F (36.7 C)     Temp Source 01/17/18 1613 Oral     SpO2 01/17/18 1613 94 %     Weight 01/17/18 1632 139 lb (63 kg)     Height 01/17/18 1632 5\' 8"  (1.727 m)     Pain Score 01/17/18 1632 0   Constitutional: Alert and oriented. Well appearing and in no acute distress. Eyes: Conjunctivae are normal. Head:  Atraumatic. Nose: No congestion/rhinnorhea. Mouth/Throat: Mucous membranes are moist.  Neck: No stridor.  Cardiovascular: Bradycardia. Good peripheral circulation. Grossly normal heart sounds.   Respiratory: Normal respiratory effort.  No retractions. Lungs CTAB. Gastrointestinal: Soft and nontender. No distention.  Musculoskeletal: No lower extremity tenderness nor edema. No gross deformities of extremities. Neurologic:   Normal speech and language. No gross focal neurologic deficits are appreciated.  Skin:  Skin is warm, dry and intact. No rash noted.  ____________________________________________   LABS (all labs ordered are listed, but only abnormal results are displayed)  Labs Reviewed  BASIC METABOLIC PANEL - Abnormal; Notable for the following components:      Result Value   Glucose, Bld 105 (*)    All other components within normal limits  URINALYSIS, ROUTINE W REFLEX MICROSCOPIC - Abnormal; Notable for the following components:   Color, Urine STRAW (*)    Hgb urine dipstick SMALL (*)    Ketones, ur 20 (*)    All other components within normal limits  HEPATIC FUNCTION PANEL - Abnormal; Notable for the following components:   ALT 14 (*)    All other components within normal limits  CBC  MAGNESIUM  TSH  LIPASE, BLOOD  TROPONIN I  TROPONIN I  TROPONIN I  CBG MONITORING, ED  I-STAT TROPONIN, ED   ____________________________________________  EKG   EKG Interpretation  Date/Time:  Wednesday January 17 2018 16:13:53 EST Ventricular Rate:  55 PR Interval:  202 QRS Duration: 94 QT Interval:  430 QTC Calculation: 411 R Axis:   69 Text Interpretation:  Sinus bradycardia with marked sinus arrhythmia Moderate voltage criteria for LVH, may be normal variant Borderline ECG No STEMI. No prior tracing.  Confirmed by Nanda Quinton [phone]) on 01/17/2018 5:39:03 PM       ____________________________________________  RADIOLOGY  Dg Chest 2 View  Result Date: 01/17/2018 CLINICAL DATA:  Epigastric pain with chest pain and diarrhea. History of COPD. EXAM: CHEST  2 VIEW COMPARISON:  05/23/2017 FINDINGS: Normal heart size with tortuous atherosclerotic, nonaneurysmal aorta. Mild pulmonary hyperinflation with stable slightly coarsened interstitial lung markings. No alveolar consolidation or CHF. No effusion or pneumothorax. Mild degenerative change along the dorsal spine. IMPRESSION: Emphysematous  hyperinflation of the lungs without active pulmonary disease. Aortic atherosclerosis. Electronically Signed   By: Ashley Royalty M.D.   On: 01/17/2018 18:51   Ct Head Wo Contrast  Result Date: 01/17/2018 CLINICAL DATA:  77 year old male with a history of blurry vision EXAM: CT HEAD WITHOUT CONTRAST TECHNIQUE: Contiguous axial images were obtained from the base of the skull through the vertex without intravenous contrast. COMPARISON:  None. FINDINGS: Brain: No acute intracranial hemorrhage. No midline shift or mass effect. Gray-white differentiation is maintained. Confluent hypodensity in the periventricular white matter. Unremarkable configuration the ventricles. Vascular: Dense calcifications of the anterior circulation. Minimal calcifications of the posterior circulation. Skull: No acute bony abnormality.  No aggressive bony lesions. Sinuses/Orbits: Opacification of the inferior recesses of the left maxillary sinus. Questionable involvement of the dentition of the left maxillary molar teeth Other: None IMPRESSION: CT is negative for acute intracranial abnormality. 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 Left maxillary sinus disease, potentially related to the left-sided maxillary dentition. Electronically Signed   By: Corrie Mckusick D.O.   On: 01/17/2018 18:43    ____________________________________________   PROCEDURES  Procedure(s) performed:   Procedures  None ____________________________________________   INITIAL IMPRESSION / ASSESSMENT AND PLAN / ED COURSE  Pertinent labs & imaging results that  were available during my care of the patient were reviewed by me and considered in my medical decision making (see chart for details).  Patient presents to the emergency department with multiple complaints including generalized fatigue, blurred vision, chest pain, epigastric discomfort.  Patient's upper chest pain seems fairly atypical and  fleeting.  He is having more persistent lower chest/upper abdomen pain not affected by food.  No tenderness to palpation.  I am concerned that this could be atypical ACS presentation.  Patient has significant risk factors for acute coronary syndrome. Plan for enzymes, CXR, EKG. low suspicion for central etiology of blurred vision.  Son also mentioned to me later in the evaluation that he has been intermittently confused today.  He is currently at his mental status baseline.  Plan to start with CT head and reassess.   08:28 PM Patient with no active chest pain. Plan for admission for CP r/o.   Discussed patient's case with Hospitalist, Dr. Alcario Drought to request admission. Patient and family (if present) updated with plan. Care transferred to [facility] service.  I reviewed all nursing notes, vitals, pertinent old records, EKGs, labs, imaging (as available).  ____________________________________________  FINAL CLINICAL IMPRESSION(S) / ED DIAGNOSES  Final diagnoses:  Precordial chest pain     MEDICATIONS GIVEN DURING THIS VISIT:  Medications  acetaminophen (TYLENOL) tablet 650 mg (not administered)  ondansetron (ZOFRAN) injection 4 mg (not administered)  enoxaparin (LOVENOX) injection 40 mg (not administered)  nicotine (NICODERM CQ - dosed in mg/24 hours) patch 21 mg (not administered)  fluticasone furoate-vilanterol (BREO ELLIPTA) 200-25 MCG/INH 1 puff (not administered)  pantoprazole (PROTONIX) EC tablet 40 mg (not administered)  pravastatin (PRAVACHOL) tablet 80 mg (not administered)  levothyroxine (SYNTHROID, LEVOTHROID) tablet 125 mcg (not administered)  sodium chloride 0.9 % bolus 500 mL (0 mLs Intravenous Stopped 01/17/18 1948)    Note:  This document was prepared using Dragon voice recognition software and may include unintentional dictation errors.  Nanda Quinton, MD Emergency Medicine    Long, [facility], MD 01/17/18 [phone]

## 2018-01-17 NOTE — ED Triage Notes (Signed)
Pt. Stated, Edward Matthews had some several bad days of having some chest pain,l diarrhea, blurred vision and not feeling good at all.

## 2018-01-18 ENCOUNTER — Other Ambulatory Visit: Payer: Self-pay

## 2018-01-18 ENCOUNTER — Encounter (HOSPITAL_COMMUNITY): Payer: Self-pay | Admitting: Physician Assistant

## 2018-01-18 DIAGNOSIS — R079 Chest pain, unspecified: Secondary | ICD-10-CM

## 2018-01-18 DIAGNOSIS — H538 Other visual disturbances: Secondary | ICD-10-CM | POA: Diagnosis not present

## 2018-01-18 DIAGNOSIS — E7800 Pure hypercholesterolemia, unspecified: Secondary | ICD-10-CM | POA: Diagnosis not present

## 2018-01-18 DIAGNOSIS — J439 Emphysema, unspecified: Secondary | ICD-10-CM

## 2018-01-18 DIAGNOSIS — E039 Hypothyroidism, unspecified: Secondary | ICD-10-CM | POA: Diagnosis not present

## 2018-01-18 DIAGNOSIS — J9611 Chronic respiratory failure with hypoxia: Secondary | ICD-10-CM

## 2018-01-18 DIAGNOSIS — Z720 Tobacco use: Secondary | ICD-10-CM | POA: Diagnosis not present

## 2018-01-18 DIAGNOSIS — R072 Precordial pain: Secondary | ICD-10-CM | POA: Diagnosis not present

## 2018-01-18 DIAGNOSIS — K219 Gastro-esophageal reflux disease without esophagitis: Secondary | ICD-10-CM | POA: Diagnosis not present

## 2018-01-18 DIAGNOSIS — J449 Chronic obstructive pulmonary disease, unspecified: Secondary | ICD-10-CM | POA: Diagnosis not present

## 2018-01-18 DIAGNOSIS — F17210 Nicotine dependence, cigarettes, uncomplicated: Secondary | ICD-10-CM | POA: Diagnosis not present

## 2018-01-18 LAB — LIPID PANEL
Cholesterol: 132 mg/dL (ref 0–200)
HDL: 47 mg/dL (ref >40)
LDL CALC: 74 mg/dL (ref 0–99)
Total CHOL/HDL Ratio: 2.8 RATIO
Triglycerides: 56 mg/dL (ref <150)
VLDL: 11 mg/dL (ref 0–40)

## 2018-01-18 LAB — TROPONIN I
Troponin I: <0.03 ng/mL (ref <0.03)
Troponin I: <0.03 ng/mL (ref <0.03)

## 2018-01-18 MED ORDER — LEVOTHYROXINE SODIUM 25 MCG PO TABS
125.0000 ug | ORAL_TABLET | Freq: Every day | ORAL | Status: DC
  Administered 2018-01-18: 125 ug via ORAL
  Filled 2018-01-18: qty 1

## 2018-01-18 MED ORDER — ALUM & MAG HYDROXIDE-SIMETH 200-200-20 MG/5ML PO SUSP
15.0000 mL | ORAL | Status: DC | PRN
  Administered 2018-01-18: 15 mL via ORAL
  Filled 2018-01-18: qty 30

## 2018-01-18 MED ORDER — NICOTINE 21 MG/24HR TD PT24: 21.0000 mg | MEDICATED_PATCH | Freq: Every day | TRANSDERMAL | 0 refills | Status: DC

## 2018-01-18 MED ORDER — ASPIRIN EC 81 MG PO TBEC: 81.0000 mg | DELAYED_RELEASE_TABLET | Freq: Every day | ORAL | Status: DC

## 2018-01-18 NOTE — Discharge Instructions (Signed)
Chest Wall Pain °Chest wall pain is pain in or around the bones and muscles of your chest. Sometimes, an injury causes this pain. Sometimes, the cause may not be known. This pain may take several weeks or longer to get better. °Follow these instructions at home: °Pay attention to any changes in your symptoms. Take these actions to help with your pain: °· Rest as told by your health care provider. °· Avoid activities that cause pain. These include any activities that use your chest muscles or your abdominal and side muscles to lift heavy items. °· If directed, apply ice to the painful area: °? Put ice in a plastic bag. °? Place a towel between your skin and the bag. °? Leave the ice on for 20 minutes, 2-3 times per day. °· Take over-the-counter and prescription medicines only as told by your health care provider. °· Do not use tobacco products, including cigarettes, chewing tobacco, and e-cigarettes. If you need help quitting, ask your health care provider. °· Keep all follow-up visits as told by your health care provider. This is important. ° °Contact a health care provider if: °· You have a fever. °· Your chest pain becomes worse. °· You have new symptoms. °Get help right away if: °· You have nausea or vomiting. °· You feel sweaty or light-headed. °· You have a cough with phlegm (sputum) or you cough up blood. °· You develop shortness of breath. °This information is not intended to replace advice given to you by your health care provider. Make sure you discuss any questions you have with your health care provider. °Document Released: 12/12/2005 Document Revised: 04/21/2016 Document Reviewed: 03/09/2015 °Elsevier Interactive Patient Education © 2018 [employer] ° °

## 2018-01-18 NOTE — Consult Note (Signed)
Cardiology Consultation:   Patient ID: Edward Matthews; [id number]; June 24, 1941   Admit date: 01/17/2018 Date of Consult: 01/18/2018  Primary Care Provider: [name], MD Primary Cardiologist: Skeet Latch, MD  Primary Electrophysiologist:     Patient Profile:   Edward Matthews is a 77 y.o. male with a hx of COPD, current smoker on home O2 at night, and GERD who is being seen today for the evaluation of chest pain at the request of Dr. Rockne Menghini.  History of Present Illness:   Edward Matthews presented to [facility] with worsened epigastric / substernal chest pain. He also reports diarrhea and blurry vision. On my interview, he has been having epigastric pain associated with reflux and DOE for months. He denies chest pain, but points to his substernal chest and epigastric area when describing the pain. The pain is not exertional in nature, generally occurs after eating, and is relieved by maalox and antacids. His breathing has been worsening, which is what brought him to the hospital. He states that he knows the pain is from his GERD. HPI difficult to gather from patient.  On arrival, EKG with nonspecific ST changes and negative troponin. He has not received any nitro. He had maalox early this morning which relieved relieved his pain with mild residual discomfort.   Past Medical History:  Diagnosis Date  . COPD (chronic obstructive pulmonary disease) ([place])   . GERD (gastroesophageal reflux disease)   . Hypothyroidism     Past Surgical History:  Procedure Laterality Date  . COLONOSCOPY WITH PROPOFOL N/A 07/28/2017   Procedure: COLONOSCOPY WITH PROPOFOL;  Surgeon: Carol Ada, MD;  Location: [facility];  Service: Endoscopy;  Laterality: N/A;  . ESOPHAGOGASTRODUODENOSCOPY (EGD) WITH PROPOFOL N/A 07/28/2017   Procedure: ESOPHAGOGASTRODUODENOSCOPY (EGD) WITH PROPOFOL;  Surgeon: Carol Ada, MD;  Location: [facility];  Service: Endoscopy;  Laterality: N/A;  . TONSILLECTOMY        Home Medications:  Prior to Admission medications   Medication Sig Start Date End Date Taking? Authorizing Provider  ADVAIR DISKUS 250-50 MCG/DOSE AEPB Inhale 2 puffs into the lungs 2 (two) times daily.  05/30/17  Yes [provider]  dexlansoprazole (DEXILANT) 60 MG capsule Take 60 mg by mouth daily.   Yes [provider]  pravastatin (PRAVACHOL) 80 MG tablet Take 80 mg by mouth daily.   Yes [provider]  SYNTHROID 125 MCG tablet Take 125 mcg by mouth daily before breakfast.  05/25/17  Yes [provider]    Inpatient Medications: Scheduled Meds: . enoxaparin (LOVENOX) injection  40 mg Subcutaneous Q24H  . fluticasone furoate-vilanterol  1 puff Inhalation Daily  . levothyroxine  125 mcg Oral QAC breakfast  . nicotine  21 mg Transdermal Daily  . pantoprazole  40 mg Oral Daily  . pravastatin  80 mg Oral Daily   Continuous Infusions:  PRN Meds: acetaminophen, alum & mag hydroxide-simeth, ondansetron (ZOFRAN) IV  Allergies:   No Known Allergies  Social History:   Social History   Socioeconomic History  . Marital status: Widowed    Spouse name: Not on file  . Number of children: Not on file  . Years of education: Not on file  . Highest education level: Not on file  Social Needs  . Financial resource strain: Not on file  . Food insecurity - worry: Not on file  . Food insecurity - inability: Not on file  . Transportation needs - medical: Not on file  . Transportation needs - non-medical: Not on file  Occupational History  . Not on file  Tobacco Use  . Smoking status: Current Every Day Smoker    Packs/day: 0.50    Years: 63.00    Pack years: 31.50    Types: Cigarettes  . Smokeless tobacco: Never Used  Substance and Sexual Activity  . Alcohol use: Not on file  . Drug use: Not on file  . Sexual activity: Not on file  Other Topics Concern  . Not on file  Social History Narrative  . Not on file    Family History:    Family  History  Problem Relation Age of Onset  . Hypertension Father      ROS:  Please see the history of present illness.  ROS  All other ROS reviewed and negative.     Physical Exam/Data:   Vitals:   01/17/18 2300 01/17/18 2332 01/17/18 2345 01/18/18 0533  BP: (!) 149/83  (!) 155/72 136/62  Pulse: (!) 53  (!) 57 (!) 50  Resp: 19  18 18   Temp:   97.9 F (36.6 C) 97.8 F (36.6 C)  TempSrc:   Oral Oral  SpO2: 91%  (!) 88% 96%  Weight:  135 lb 11.2 oz (61.6 kg)    Height:  5\' 9"  (1.753 m)      Intake/Output Summary (Last 24 hours) at 01/18/2018 0801 Last data filed at 01/18/2018 0534 Gross per 24 hour  Intake 820 ml  Output 475 ml  Net 345 ml   Filed Weights   01/17/18 1632 01/17/18 2332  Weight: 139 lb (63 kg) 135 lb 11.2 oz (61.6 kg)   Body mass index is 20.04 kg/m.  General:  Well nourished, well developed, in no acute distress HEENT: normal Neck: no JVD Vascular: No carotid bruits  Cardiac:  normal S1, S2; RRR; no murmur  Lungs:  clear to auscultation bilaterally, no wheezing, rhonchi or rales  Abd: soft, nontender, no hepatomegaly  Ext: no edema Musculoskeletal:  No deformities, BUE and BLE strength normal and equal Skin: warm and dry  Neuro:  CNs 2-12 intact, no focal abnormalities noted Psych:  Normal affect   EKG:  The EKG was personally reviewed and demonstrates:  Sinus with nonspecific ST changes Telemetry:  Telemetry was personally reviewed and demonstrates:  Sinus to sinus brady  Relevant CV Studies:  None  Laboratory Data:  Chemistry Recent Labs  Lab 01/17/18 1758  NA 140  K 3.9  CL 107  CO2 22  GLUCOSE 105*  BUN 13  CREATININE 0.95  CALCIUM 8.9  GFRNONAA >60  GFRAA >60  ANIONGAP 11    Recent Labs  Lab 01/17/18 1758  PROT 6.8  ALBUMIN 3.7  AST 16  ALT 14*  ALKPHOS 93  BILITOT 0.6   Hematology Recent Labs  Lab 01/17/18 1758  WBC 9.5  RBC 4.64  HGB 14.4  HCT 43.0  MCV 92.7  MCH 31.0  MCHC 33.5  RDW 13.4  PLT 179    Cardiac Enzymes Recent Labs  Lab 01/17/18 2203 01/18/18 0104 01/18/18 0424  TROPONINI <0.03 <0.03 <0.03    Recent Labs  Lab 01/17/18 1704  TROPIPOC 0.00    BNPNo results for input(s): BNP, PROBNP in the last 168 hours.  DDimer No results for input(s): DDIMER in the last 168 hours.  Radiology/Studies:  Dg Chest 2 View  Result Date: 01/17/2018 CLINICAL DATA:  Epigastric pain with chest pain and diarrhea. History of COPD. EXAM: CHEST  2 VIEW COMPARISON:  05/23/2017 FINDINGS: Normal heart size  with tortuous atherosclerotic, nonaneurysmal aorta. Mild pulmonary hyperinflation with stable slightly coarsened interstitial lung markings. No alveolar consolidation or CHF. No effusion or pneumothorax. Mild degenerative change along the dorsal spine. IMPRESSION: Emphysematous hyperinflation of the lungs without active pulmonary disease. Aortic atherosclerosis. Electronically Signed   By: Ashley Royalty M.D.   On: 01/17/2018 18:51   Ct Head Wo Contrast  Result Date: 01/17/2018 CLINICAL DATA:  77 year old male with a history of blurry vision EXAM: CT HEAD WITHOUT CONTRAST TECHNIQUE: Contiguous axial images were obtained from the base of the skull through the vertex without intravenous contrast. COMPARISON:  None. FINDINGS: Brain: No acute intracranial hemorrhage. No midline shift or mass effect. Gray-white differentiation is maintained. Confluent hypodensity in the periventricular white matter. Unremarkable configuration the ventricles. Vascular: Dense calcifications of the anterior circulation. Minimal calcifications of the posterior circulation. Skull: No acute bony abnormality.  No aggressive bony lesions. Sinuses/Orbits: Opacification of the inferior recesses of the left maxillary sinus. Questionable involvement of the dentition of the left maxillary molar teeth Other: None IMPRESSION: CT is negative for acute intracranial abnormality. Changes of chronic microvascular ischemic disease with associated  intracranial atherosclerosis. Left maxillary sinus disease, potentially related to the left-sided maxillary dentition. Electronically Signed   By: Corrie Mckusick D.O.   On: 01/17/2018 18:43    Assessment and Plan:   1. Chest pain - troponin x 4 negative - EKG with nonspecific ST changes - no EKGs to compare - pt has risk factors for ACS, including current every day smoker and HLD, on statin at home.  - this pain is atypical in nature and sounds GI in origin. However, given is risk factors, pt would benefit from an ischemic evaluation. Obtain echocardiogram. Will plan for coronary CT vs lesixscan myoview.   2. GERD - per primary team   3. Current smoker, COPD - wearing supplemental O2 - no wheezing currently   For questions or updates, please contact [place] Please consult [email or web address] for contact info under Cardiology/STEMI.   Signed, [place], [place]  01/18/2018 8:01 AM

## 2018-01-18 NOTE — Discharge Summary (Addendum)
Physician Discharge Summary  Edward Matthews MOQ:[id number] DOB: [birth year] DOA: 01/17/2018  [name], MD  Admit date: 01/17/2018 Discharge date: 01/18/2018  Admitted From: Home Discharge disposition: Home   Recommendations for Outpatient Follow-Up:   1. [facility] will call the patient to arrange for an outpatient stress test.   Discharge Diagnosis:   Principal Problem:   Chest pain Active Problems:   COPD (chronic obstructive pulmonary disease) (HCC)   Chronic respiratory failure with hypoxia ([place])   Tobacco abuse   Pure hypercholesterolemia   Hypothyroidism   Underweight   GERD  Discharge Condition: Improved.  Diet recommendation: Low sodium, heart healthy.    Code status: Full.  History of Present Illness:    Edward Matthews is an 77 y.o. male with a PMH of COPD, chronic respiratory failure on home oxygen at night, ongoing tobacco abuse, and GERD who was admitted 01/17/18 for evaluation of epigastric/substernal chest pain, diarrhea and blurry vision. He also reported worsening exertional dyspnea. On initial presentation, he had tachypnea and hypoxia. Chest x-ray was negative for infiltrates.  Hospital Course by Problem:   Principal Problem:   Chest pain Suspect pleuritic in etiology. Troponins negative. After lengthy discussion with him about his HPI, he tells me he didn't have chest pain, but felt like he had a "panic attack" after he was told to come to the ED because his blood pressure was high. He was seen by the cardiologist and stress testing will be done as an outpatient.   Active Problems:   GERD Continue Dexilant.    Hypothyroidism Continue synthroid.    HLD LDL controlled on Pravastatin.    Tobacco abuse Counseled by myself prior to discharge.    COPD (chronic obstructive pulmonary disease) (HCC) with chronic respiratory failure with hypoxia Likely progressive disease. May need to increase his oxygen to 24/7.   Underweight   Body mass index is 20.04 kg/m.   Medical Consultants:    Cardiology   Discharge Exam:   Vitals:   01/17/18 2345 01/18/18 0533  BP: (!) 155/72 136/62  Pulse: (!) 57 (!) 50  Resp: 18 18  Temp: 97.9 F (36.6 C) 97.8 F (36.6 C)  SpO2: (!) 88% 96%   Vitals:   01/17/18 2300 01/17/18 2332 01/17/18 2345 01/18/18 0533  BP: (!) 149/83  (!) 155/72 136/62  Pulse: (!) 53  (!) 57 (!) 50  Resp: 19  18 18   Temp:   97.9 F (36.6 C) 97.8 F (36.6 C)  TempSrc:   Oral Oral  SpO2: 91%  (!) 88% 96%  Weight:  61.6 kg (135 lb 11.2 oz)    Height:  5\' 9"  (1.753 m)      General exam: Appears calm and comfortable.  Respiratory system: Clear to auscultation, but diminished. Respiratory effort normal. Cardiovascular system: S1 & S2 heard, RRR. No JVD,  rubs, gallops or clicks. No murmurs. Gastrointestinal system: Abdomen is nondistended, soft and nontender. No organomegaly or masses felt. Normal bowel sounds heard. Central nervous system: Alert and oriented. No focal neurological deficits. Extremities: No clubbing,  or cyanosis. No edema. Skin: No rashes, lesions or ulcers. Psychiatry: Judgement and insight appear slightly impaired. Mood & affect appropriate.    The results of significant diagnostics from this hospitalization (including imaging, microbiology, ancillary and laboratory) are listed below for reference.     Procedures and Diagnostic Studies:   Dg Chest 2 View  Result Date: 01/17/2018 CLINICAL DATA:  Epigastric pain with chest pain and  diarrhea. History of COPD. EXAM: CHEST  2 VIEW COMPARISON:  05/23/2017 FINDINGS: Normal heart size with tortuous atherosclerotic, nonaneurysmal aorta. Mild pulmonary hyperinflation with stable slightly coarsened interstitial lung markings. No alveolar consolidation or CHF. No effusion or pneumothorax. Mild degenerative change along the dorsal spine. IMPRESSION: Emphysematous hyperinflation of the lungs without active pulmonary  disease. Aortic atherosclerosis. Electronically Signed   By: Ashley Royalty M.D.   On: 01/17/2018 18:51   Ct Head Wo Contrast  Result Date: 01/17/2018 CLINICAL DATA:  77 year old male with a history of blurry vision EXAM: CT HEAD WITHOUT CONTRAST TECHNIQUE: Contiguous axial images were obtained from the base of the skull through the vertex without intravenous contrast. COMPARISON:  None. FINDINGS: Brain: No acute intracranial hemorrhage. No midline shift or mass effect. Gray-white differentiation is maintained. Confluent hypodensity in the periventricular white matter. Unremarkable configuration the ventricles. Vascular: Dense calcifications of the anterior circulation. Minimal calcifications of the posterior circulation. Skull: No acute bony abnormality.  No aggressive bony lesions. Sinuses/Orbits: Opacification of the inferior recesses of the left maxillary sinus. Questionable involvement of the dentition of the left maxillary molar teeth Other: None IMPRESSION: CT is negative for acute intracranial abnormality. 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 Left maxillary sinus disease, potentially related to the left-sided maxillary dentition. Electronically Signed   By: Corrie Mckusick D.O.   On: 01/17/2018 18:43     Labs:   Basic Metabolic Panel: Recent Labs  Lab 01/17/18 1758  NA 140  K 3.9  CL 107  CO2 22  GLUCOSE 105*  BUN 13  CREATININE 0.95  CALCIUM 8.9  MG 2.0   GFR Estimated Creatinine Clearance: 58.5 mL/min (by C-G formula based on SCr of 0.95 mg/dL). Liver Function Tests: Recent Labs  Lab 01/17/18 1758  AST 16  ALT 14*  ALKPHOS 93  BILITOT 0.6  PROT 6.8  ALBUMIN 3.7   Recent Labs  Lab 01/17/18 2203  LIPASE 22   CBC: Recent Labs  Lab 01/17/18 1758  WBC 9.5  HGB 14.4  HCT 43.0  MCV 92.7  PLT 179   Cardiac Enzymes: Recent Labs  Lab 01/17/18 2203 01/18/18 0104 01/18/18 0424  TROPONINI <0.03 <0.03 <0.03    Lipid Profile Recent Labs    01/18/18 0424  CHOL 132  HDL 47  LDLCALC 74  TRIG 56  CHOLHDL 2.8   Thyroid function studies Recent Labs    01/17/18 1758  TSH 0.511    Discharge Instructions:   Discharge Instructions    Call MD for:  difficulty breathing, headache or visual disturbances   Complete by:  As directed    Call MD for:  extreme fatigue   Complete by:  As directed    Call MD for:  temperature >100.4   Complete by:  As directed    Diet - low sodium heart healthy   Complete by:  As directed    Increase activity slowly   Complete by:  As directed      Allergies as of 01/18/2018   No Known Allergies     Medication List    TAKE these medications   ADVAIR DISKUS 250-50 MCG/DOSE Aepb Generic drug:  Fluticasone-Salmeterol Inhale 2 puffs into the lungs 2 (two) times daily.   DEXILANT 60 MG capsule Generic drug:  dexlansoprazole Take 60 mg by mouth daily.   nicotine 21 mg/24hr patch Commonly known as:  NICODERM CQ - dosed in mg/24 hours Place 1 patch (21 mg total) onto the skin  daily.   pravastatin 80 MG tablet Commonly known as:  PRAVACHOL Take 80 mg by mouth daily.   SYNTHROID 125 MCG tablet Generic drug:  levothyroxine Take 125 mcg by mouth daily before breakfast.      Follow-up Information    [name], MD. Schedule an appointment as soon as possible for a visit in 1 week(s).   Specialty:  Internal Medicine Contact information: [place] [address] [phone]        Skeet Latch, MD .   Specialty:  Cardiology Contact information: [address] [place] [address] [phone]            Time coordinating discharge: 30 minutes.  SignedMargreta Journey Lida Berkery  Pager [phone] [facility] 01/18/2018, 3:30 PM

## 2018-01-24 DIAGNOSIS — J439 Emphysema, unspecified: Secondary | ICD-10-CM | POA: Diagnosis not present

## 2018-01-24 DIAGNOSIS — R634 Abnormal weight loss: Secondary | ICD-10-CM | POA: Diagnosis not present

## 2018-01-24 DIAGNOSIS — M791 Myalgia, unspecified site: Secondary | ICD-10-CM | POA: Diagnosis not present

## 2018-01-24 DIAGNOSIS — R0789 Other chest pain: Secondary | ICD-10-CM | POA: Diagnosis not present

## 2018-01-24 DIAGNOSIS — Z87891 Personal history of nicotine dependence: Secondary | ICD-10-CM | POA: Diagnosis not present

## 2018-01-24 DIAGNOSIS — R0600 Dyspnea, unspecified: Secondary | ICD-10-CM | POA: Diagnosis not present

## 2018-01-24 DIAGNOSIS — E7800 Pure hypercholesterolemia, unspecified: Secondary | ICD-10-CM | POA: Diagnosis not present

## 2018-01-29 DIAGNOSIS — J449 Chronic obstructive pulmonary disease, unspecified: Secondary | ICD-10-CM | POA: Diagnosis not present

## 2018-02-01 DIAGNOSIS — I700 Atherosclerosis of aorta: Secondary | ICD-10-CM | POA: Diagnosis not present

## 2018-02-01 DIAGNOSIS — J432 Centrilobular emphysema: Secondary | ICD-10-CM | POA: Diagnosis not present

## 2018-02-01 DIAGNOSIS — Z87891 Personal history of nicotine dependence: Secondary | ICD-10-CM | POA: Diagnosis not present

## 2018-02-01 DIAGNOSIS — R0789 Other chest pain: Secondary | ICD-10-CM | POA: Diagnosis not present

## 2018-02-05 DIAGNOSIS — R0789 Other chest pain: Secondary | ICD-10-CM | POA: Diagnosis not present

## 2018-02-14 DIAGNOSIS — R0789 Other chest pain: Secondary | ICD-10-CM | POA: Diagnosis not present

## 2018-02-21 DIAGNOSIS — J432 Centrilobular emphysema: Secondary | ICD-10-CM | POA: Diagnosis not present

## 2018-02-21 DIAGNOSIS — I700 Atherosclerosis of aorta: Secondary | ICD-10-CM | POA: Diagnosis not present

## 2018-02-21 DIAGNOSIS — R0789 Other chest pain: Secondary | ICD-10-CM | POA: Diagnosis not present

## 2018-02-21 DIAGNOSIS — I340 Nonrheumatic mitral (valve) insufficiency: Secondary | ICD-10-CM | POA: Diagnosis not present

## 2018-02-23 DIAGNOSIS — Z87891 Personal history of nicotine dependence: Secondary | ICD-10-CM | POA: Diagnosis not present

## 2018-02-23 DIAGNOSIS — E7800 Pure hypercholesterolemia, unspecified: Secondary | ICD-10-CM | POA: Diagnosis not present

## 2018-02-23 DIAGNOSIS — R0789 Other chest pain: Secondary | ICD-10-CM | POA: Diagnosis not present

## 2018-02-23 DIAGNOSIS — R0600 Dyspnea, unspecified: Secondary | ICD-10-CM | POA: Diagnosis not present

## 2018-02-23 DIAGNOSIS — J439 Emphysema, unspecified: Secondary | ICD-10-CM | POA: Diagnosis not present

## 2018-02-23 DIAGNOSIS — R634 Abnormal weight loss: Secondary | ICD-10-CM | POA: Diagnosis not present

## 2018-02-23 DIAGNOSIS — M791 Myalgia, unspecified site: Secondary | ICD-10-CM | POA: Diagnosis not present

## 2018-02-26 DIAGNOSIS — J449 Chronic obstructive pulmonary disease, unspecified: Secondary | ICD-10-CM | POA: Diagnosis not present

## 2018-03-07 DIAGNOSIS — H6122 Impacted cerumen, left ear: Secondary | ICD-10-CM | POA: Diagnosis not present

## 2018-03-07 DIAGNOSIS — R1312 Dysphagia, oropharyngeal phase: Secondary | ICD-10-CM | POA: Diagnosis not present

## 2018-03-29 DIAGNOSIS — J449 Chronic obstructive pulmonary disease, unspecified: Secondary | ICD-10-CM | POA: Diagnosis not present

## 2018-04-19 DIAGNOSIS — F17200 Nicotine dependence, unspecified, uncomplicated: Secondary | ICD-10-CM | POA: Diagnosis not present

## 2018-04-19 DIAGNOSIS — M549 Dorsalgia, unspecified: Secondary | ICD-10-CM | POA: Diagnosis not present

## 2018-04-19 DIAGNOSIS — J449 Chronic obstructive pulmonary disease, unspecified: Secondary | ICD-10-CM | POA: Diagnosis not present

## 2018-04-19 DIAGNOSIS — J329 Chronic sinusitis, unspecified: Secondary | ICD-10-CM | POA: Diagnosis not present

## 2018-05-24 DIAGNOSIS — Z125 Encounter for screening for malignant neoplasm of prostate: Secondary | ICD-10-CM | POA: Diagnosis not present

## 2018-05-24 DIAGNOSIS — E7800 Pure hypercholesterolemia, unspecified: Secondary | ICD-10-CM | POA: Diagnosis not present

## 2018-05-24 DIAGNOSIS — Z0000 Encounter for general adult medical examination without abnormal findings: Secondary | ICD-10-CM | POA: Diagnosis not present

## 2018-05-24 DIAGNOSIS — E039 Hypothyroidism, unspecified: Secondary | ICD-10-CM | POA: Diagnosis not present

## 2018-05-30 DIAGNOSIS — Z720 Tobacco use: Secondary | ICD-10-CM | POA: Diagnosis not present

## 2018-05-30 DIAGNOSIS — E7800 Pure hypercholesterolemia, unspecified: Secondary | ICD-10-CM | POA: Diagnosis not present

## 2018-05-30 DIAGNOSIS — J449 Chronic obstructive pulmonary disease, unspecified: Secondary | ICD-10-CM | POA: Diagnosis not present

## 2018-05-30 DIAGNOSIS — E039 Hypothyroidism, unspecified: Secondary | ICD-10-CM | POA: Diagnosis not present

## 2018-05-30 DIAGNOSIS — K219 Gastro-esophageal reflux disease without esophagitis: Secondary | ICD-10-CM | POA: Diagnosis not present

## 2018-05-30 DIAGNOSIS — Z0000 Encounter for general adult medical examination without abnormal findings: Secondary | ICD-10-CM | POA: Diagnosis not present

## 2018-07-27 DIAGNOSIS — J449 Chronic obstructive pulmonary disease, unspecified: Secondary | ICD-10-CM | POA: Diagnosis not present

## 2018-08-15 DIAGNOSIS — R202 Paresthesia of skin: Secondary | ICD-10-CM | POA: Diagnosis not present

## 2018-08-15 DIAGNOSIS — T148XXA Other injury of unspecified body region, initial encounter: Secondary | ICD-10-CM | POA: Diagnosis not present

## 2018-08-15 DIAGNOSIS — J849 Interstitial pulmonary disease, unspecified: Secondary | ICD-10-CM | POA: Diagnosis not present

## 2018-08-27 DIAGNOSIS — J449 Chronic obstructive pulmonary disease, unspecified: Secondary | ICD-10-CM | POA: Diagnosis not present

## 2018-09-26 DIAGNOSIS — J449 Chronic obstructive pulmonary disease, unspecified: Secondary | ICD-10-CM | POA: Diagnosis not present

## 2018-10-27 DIAGNOSIS — J449 Chronic obstructive pulmonary disease, unspecified: Secondary | ICD-10-CM | POA: Diagnosis not present

## 2018-11-21 DIAGNOSIS — E7800 Pure hypercholesterolemia, unspecified: Secondary | ICD-10-CM | POA: Diagnosis not present

## 2018-11-26 DIAGNOSIS — J449 Chronic obstructive pulmonary disease, unspecified: Secondary | ICD-10-CM | POA: Diagnosis not present

## 2018-12-03 DIAGNOSIS — K219 Gastro-esophageal reflux disease without esophagitis: Secondary | ICD-10-CM | POA: Diagnosis not present

## 2018-12-03 DIAGNOSIS — J449 Chronic obstructive pulmonary disease, unspecified: Secondary | ICD-10-CM | POA: Diagnosis not present

## 2018-12-03 DIAGNOSIS — E7800 Pure hypercholesterolemia, unspecified: Secondary | ICD-10-CM | POA: Diagnosis not present

## 2018-12-03 DIAGNOSIS — E039 Hypothyroidism, unspecified: Secondary | ICD-10-CM | POA: Diagnosis not present

## 2018-12-03 DIAGNOSIS — R300 Dysuria: Secondary | ICD-10-CM | POA: Diagnosis not present

## 2018-12-26 DIAGNOSIS — J449 Chronic obstructive pulmonary disease, unspecified: Secondary | ICD-10-CM | POA: Diagnosis not present

## 2018-12-27 DIAGNOSIS — J449 Chronic obstructive pulmonary disease, unspecified: Secondary | ICD-10-CM | POA: Diagnosis not present

## 2019-01-27 DIAGNOSIS — J449 Chronic obstructive pulmonary disease, unspecified: Secondary | ICD-10-CM | POA: Diagnosis not present

## 2019-01-28 DIAGNOSIS — J449 Chronic obstructive pulmonary disease, unspecified: Secondary | ICD-10-CM | POA: Diagnosis not present

## 2019-02-13 IMAGING — CT CT HEAD W/O CM
3 series · 16 of 47 positions shown, 19 images · non-contrast
Comparison: None.

CLINICAL DATA: 76-year-old male with a history of blurry vision

EXAM:
CT HEAD WITHOUT CONTRAST
TECHNIQUE: Contiguous axial images were obtained from the base of the skull
through the vertex without intravenous contrast.

[Series 3: head 5.0 h30s · axial · 0.45mm/px · z∈[+1234,+1379]mm · 10 of 35 slices shown, 13 images]
[im 3/35  brain]
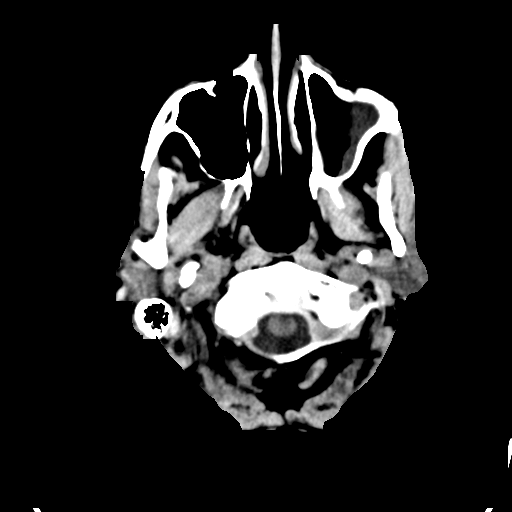
[im 3/35  bone]
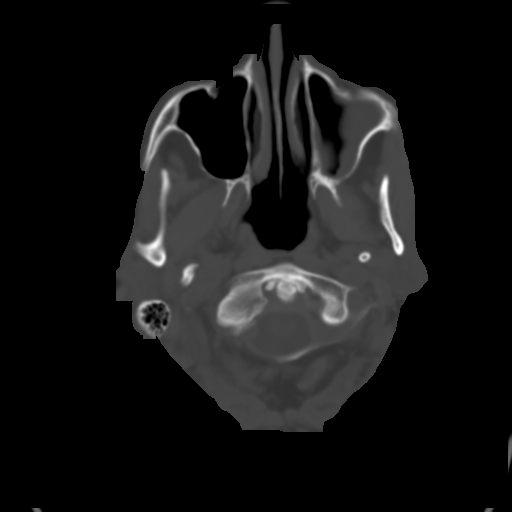
[im 6/35  brain]
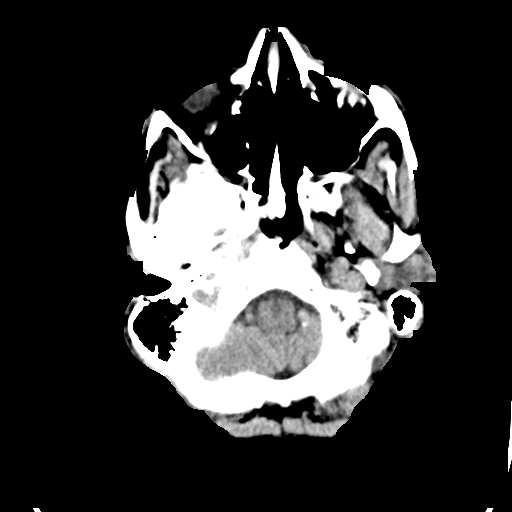
[im 10/35  brain]
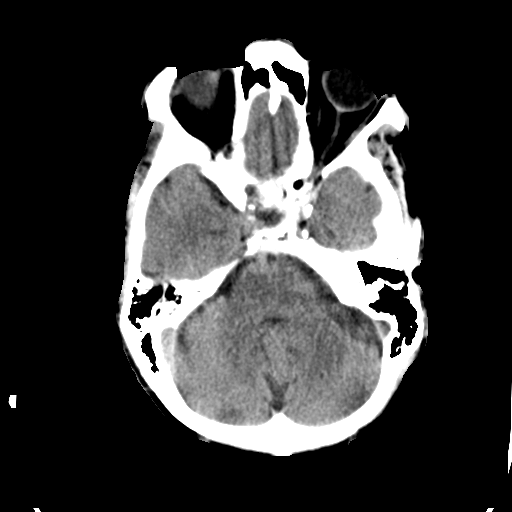
[im 12/35  brain]
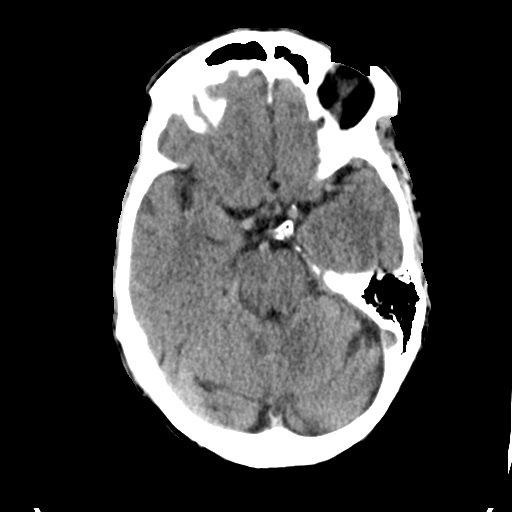
[im 16/35  brain]
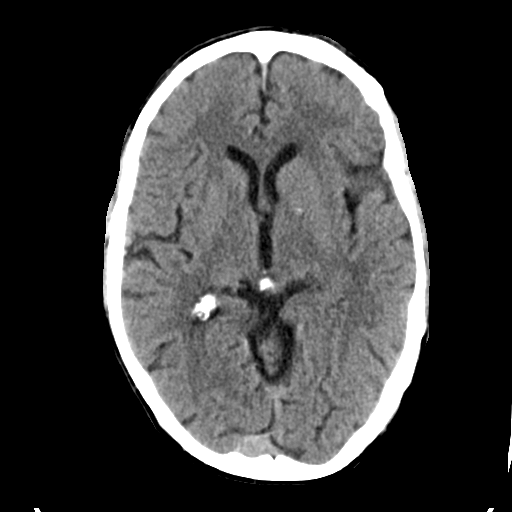
[im 16/35  bone]
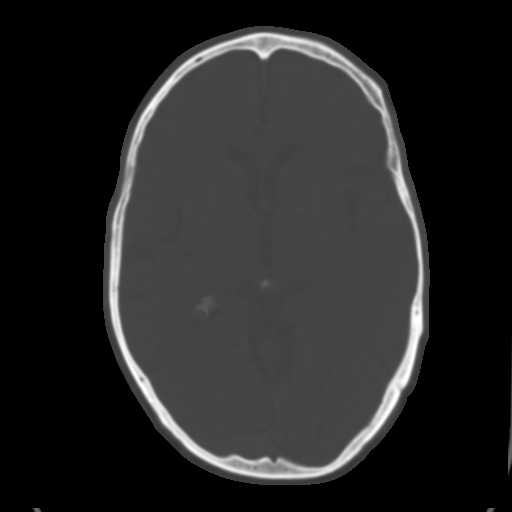
[im 19/35  brain]
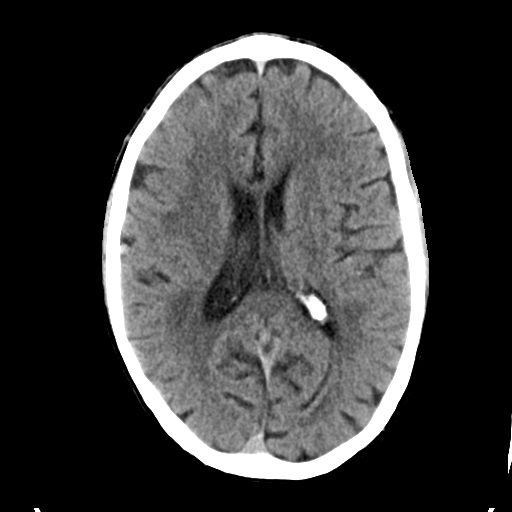
[im 23/35  brain]
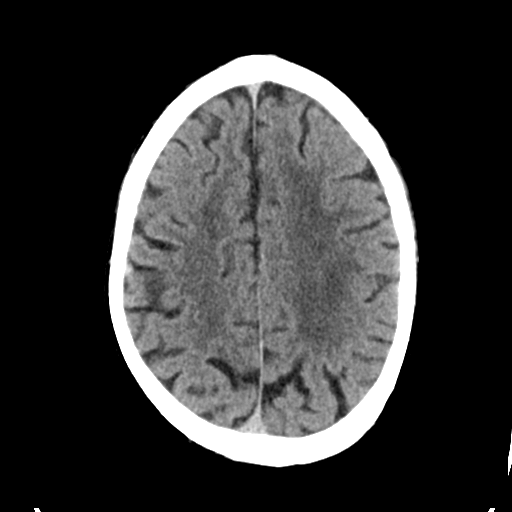
[im 26/35  brain]
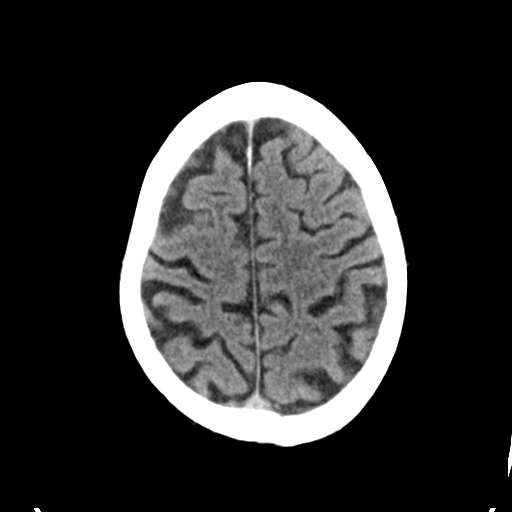
[im 29/35  brain]
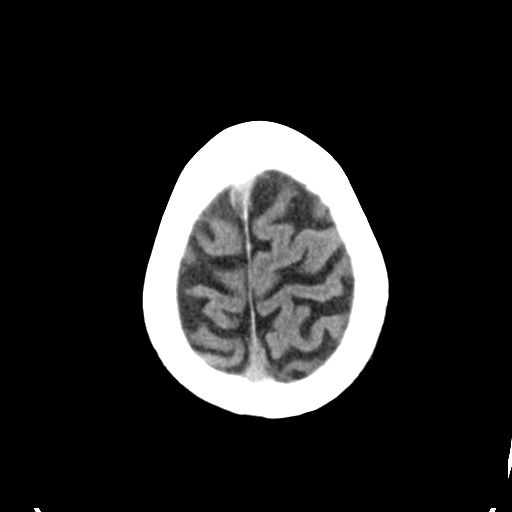
[im 29/35  bone]
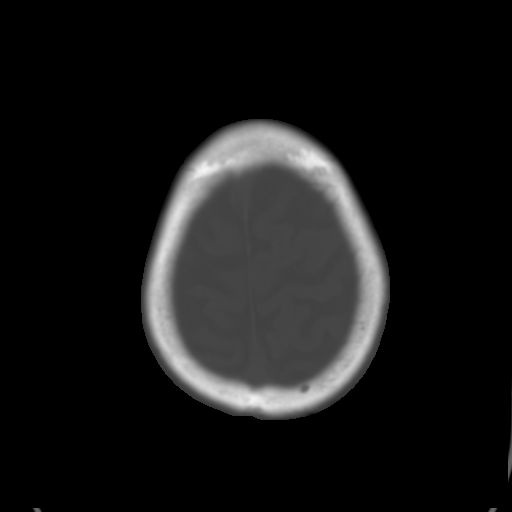
[im 32/35  brain]
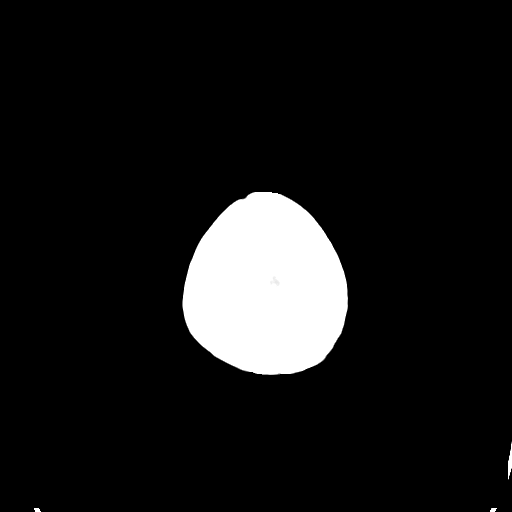

[Series 5: head 3.0 mpr cor · coronal · 0.33mm/px · 3 of 72 slices shown]
[im 24/72  brain]
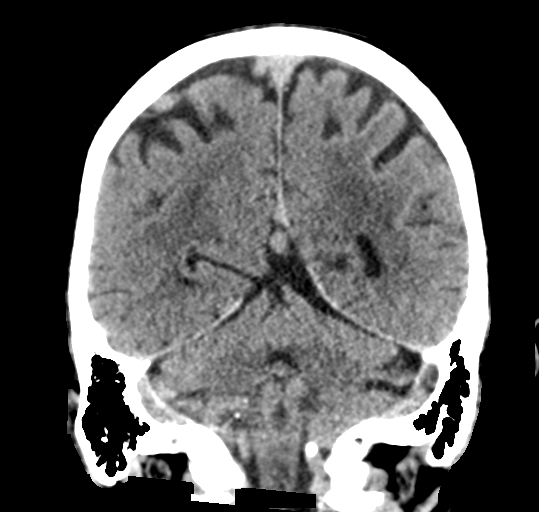
[im 32/72  brain]
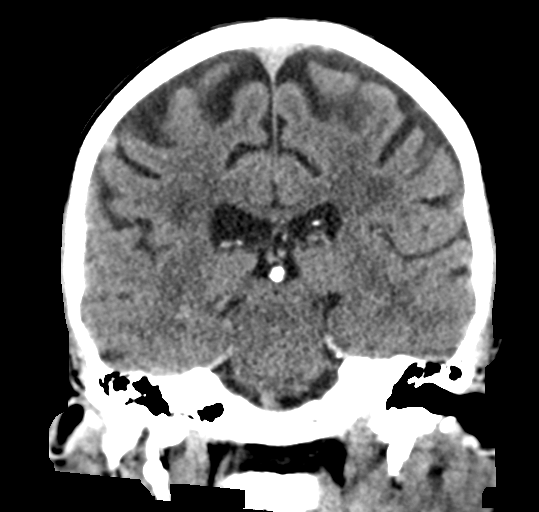
[im 40/72  brain]
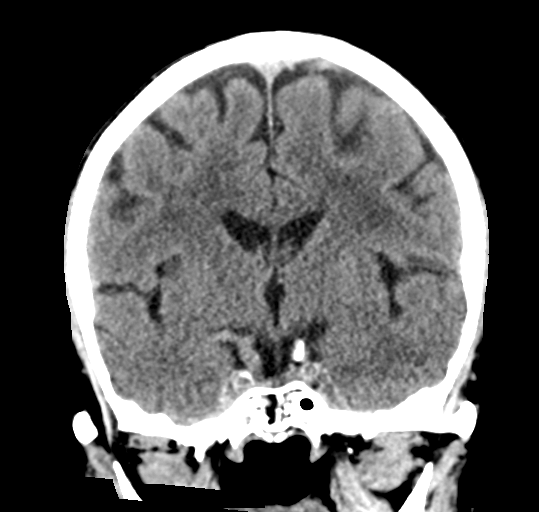

[Series 6: head 3.0 mpr sag · sagittal · 0.36mm/px · 3 of 57 slices shown]
[im 19/57  brain]
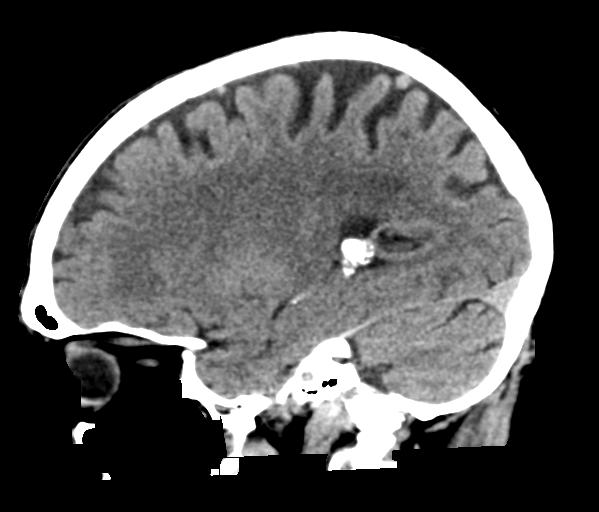
[im 29/57  brain]
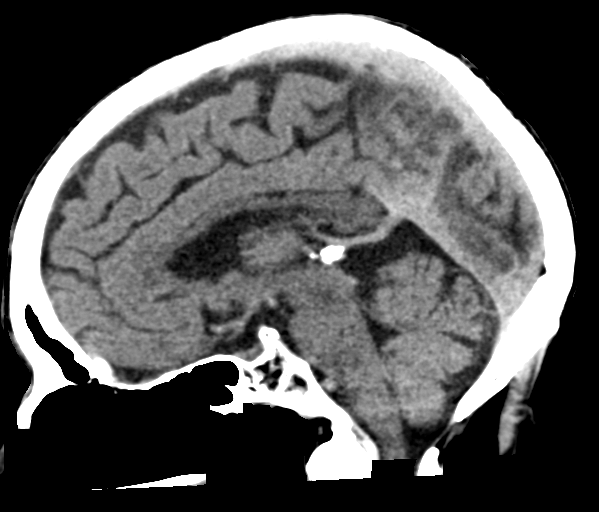
[im 38/57  brain]
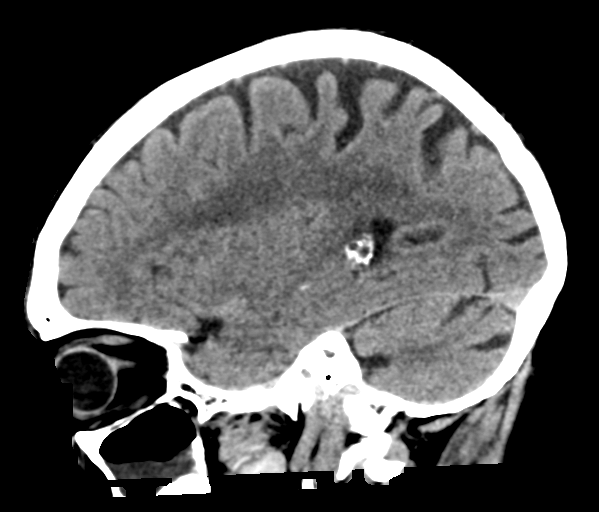

[16 of 47 positions shown; findings below may reference images not displayed]

FINDINGS: Brain: No acute intracranial hemorrhage. No midline shift or mass
effect. Gray-white differentiation is maintained. Confluent
hypodensity in the periventricular white matter. Unremarkable
configuration the ventricles.

Vascular: Dense calcifications of the anterior circulation. Minimal
calcifications of the posterior circulation.

Skull: No acute bony abnormality.  No aggressive bony lesions.

Sinuses/Orbits: Opacification of the inferior recesses of the left
maxillary sinus. Questionable involvement of the dentition of the
left maxillary molar teeth

Other: None
IMPRESSION: CT is negative for acute intracranial abnormality.

Changes of chronic microvascular ischemic disease with associated
intracranial atherosclerosis.

Left maxillary sinus disease, potentially related to the left-sided
maxillary dentition.

## 2019-02-25 DIAGNOSIS — J449 Chronic obstructive pulmonary disease, unspecified: Secondary | ICD-10-CM | POA: Diagnosis not present

## 2019-03-28 DIAGNOSIS — L821 Other seborrheic keratosis: Secondary | ICD-10-CM | POA: Diagnosis not present

## 2019-03-28 DIAGNOSIS — M545 Low back pain: Secondary | ICD-10-CM | POA: Diagnosis not present

## 2019-03-28 DIAGNOSIS — J449 Chronic obstructive pulmonary disease, unspecified: Secondary | ICD-10-CM | POA: Diagnosis not present

## 2019-04-27 DIAGNOSIS — J449 Chronic obstructive pulmonary disease, unspecified: Secondary | ICD-10-CM | POA: Diagnosis not present

## 2019-05-28 DIAGNOSIS — J449 Chronic obstructive pulmonary disease, unspecified: Secondary | ICD-10-CM | POA: Diagnosis not present

## 2019-05-31 DIAGNOSIS — E7800 Pure hypercholesterolemia, unspecified: Secondary | ICD-10-CM | POA: Diagnosis not present

## 2019-05-31 DIAGNOSIS — Z0000 Encounter for general adult medical examination without abnormal findings: Secondary | ICD-10-CM | POA: Diagnosis not present

## 2019-05-31 DIAGNOSIS — R0602 Shortness of breath: Secondary | ICD-10-CM | POA: Diagnosis not present

## 2019-05-31 DIAGNOSIS — E039 Hypothyroidism, unspecified: Secondary | ICD-10-CM | POA: Diagnosis not present

## 2019-06-12 DIAGNOSIS — R634 Abnormal weight loss: Secondary | ICD-10-CM | POA: Diagnosis not present

## 2019-06-12 DIAGNOSIS — Z0000 Encounter for general adult medical examination without abnormal findings: Secondary | ICD-10-CM | POA: Diagnosis not present

## 2019-06-12 DIAGNOSIS — J849 Interstitial pulmonary disease, unspecified: Secondary | ICD-10-CM | POA: Diagnosis not present

## 2019-06-12 DIAGNOSIS — I700 Atherosclerosis of aorta: Secondary | ICD-10-CM | POA: Diagnosis not present

## 2019-06-12 DIAGNOSIS — E7800 Pure hypercholesterolemia, unspecified: Secondary | ICD-10-CM | POA: Diagnosis not present

## 2019-06-12 DIAGNOSIS — K219 Gastro-esophageal reflux disease without esophagitis: Secondary | ICD-10-CM | POA: Diagnosis not present

## 2019-06-12 DIAGNOSIS — E039 Hypothyroidism, unspecified: Secondary | ICD-10-CM | POA: Diagnosis not present

## 2019-06-12 DIAGNOSIS — F17200 Nicotine dependence, unspecified, uncomplicated: Secondary | ICD-10-CM | POA: Diagnosis not present

## 2019-06-27 DIAGNOSIS — J449 Chronic obstructive pulmonary disease, unspecified: Secondary | ICD-10-CM | POA: Diagnosis not present

## 2019-07-26 ENCOUNTER — Other Ambulatory Visit: Payer: Self-pay

## 2019-07-28 DIAGNOSIS — J449 Chronic obstructive pulmonary disease, unspecified: Secondary | ICD-10-CM | POA: Diagnosis not present

## 2019-08-28 DIAGNOSIS — J449 Chronic obstructive pulmonary disease, unspecified: Secondary | ICD-10-CM | POA: Diagnosis not present

## 2019-09-27 DIAGNOSIS — J449 Chronic obstructive pulmonary disease, unspecified: Secondary | ICD-10-CM | POA: Diagnosis not present

## 2019-10-28 DIAGNOSIS — J449 Chronic obstructive pulmonary disease, unspecified: Secondary | ICD-10-CM | POA: Diagnosis not present

## 2019-11-08 ENCOUNTER — Encounter: Payer: Self-pay | Admitting: Podiatry

## 2019-11-08 ENCOUNTER — Other Ambulatory Visit: Payer: Self-pay

## 2019-11-08 ENCOUNTER — Ambulatory Visit (INDEPENDENT_AMBULATORY_CARE_PROVIDER_SITE_OTHER): Payer: PPO | Admitting: Podiatry

## 2019-11-08 VITALS — BP 164/86

## 2019-11-08 DIAGNOSIS — Q828 Other specified congenital malformations of skin: Secondary | ICD-10-CM

## 2019-11-08 DIAGNOSIS — M79672 Pain in left foot: Secondary | ICD-10-CM

## 2019-11-08 DIAGNOSIS — L84 Corns and callosities: Secondary | ICD-10-CM

## 2019-11-13 ENCOUNTER — Encounter: Payer: Self-pay | Admitting: Podiatry

## 2019-11-13 ENCOUNTER — Telehealth: Payer: Self-pay | Admitting: *Deleted

## 2019-11-13 NOTE — Telephone Encounter (Signed)
Hi Valery,   I had applied catherone to his foot. I am working on clinicals right now. It is completely normal to hurt with catherone therapy.   Thanks  Boneta Lucks

## 2019-11-13 NOTE — Progress Notes (Signed)
Subjective:  Patient ID: Edward Matthews, male    DOB: July 27, 1941,  MRN: [id number]  Chief Complaint  Patient presents with  . Plantar Warts    pt is here for plantar warts as well as a possible toenail trim    78 y.o. male presents with the above complaint.  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  Patient states that has been painful to ambulate on he has tried multiple different over-the-counter treatment therapies none of which has helped.  He has tried trimming it down which has not helped either.  He has not seek any other treatment modalities.  He has not seen any other previous podiatrist for this.  He denies any other acute complaints.   Review of Systems: Negative except as noted in the HPI. Denies N/V/F/Ch.  Past Medical History:  Diagnosis Date  . COPD (chronic obstructive pulmonary disease) ([place])   . GERD (gastroesophageal reflux disease)   . Hypothyroidism     Current Outpatient Medications:  .  ADVAIR DISKUS 250-50 MCG/DOSE AEPB, Inhale 2 puffs into the lungs 2 (two) times daily. , Disp: , Rfl:  .  atorvastatin (LIPITOR) 20 MG tablet, Take 20 mg by mouth daily., Disp: , Rfl:  .  dexlansoprazole (DEXILANT) 60 MG capsule, Take 60 mg by mouth daily., Disp: , Rfl:  .  nicotine (NICODERM CQ - DOSED IN MG/24 HOURS) 21 mg/24hr patch, Place 1 patch (21 mg total) onto the skin daily., Disp: 28 patch, Rfl: 0 .  ofloxacin (FLOXIN) 0.3 % OTIC solution, SMARTSIG:10 Drop(s) In Ear(s) Every 12 Hours, Disp: , Rfl:  .  pravastatin (PRAVACHOL) 80 MG tablet, Take 80 mg by mouth daily., Disp: , Rfl:  .  SYNTHROID 125 MCG tablet, Take 125 mcg by mouth daily before breakfast. , Disp: , Rfl:   Social History   Tobacco Use  Smoking Status Current Every Day Smoker  . Packs/day: 0.50  . Years: 63.00  . Pack years: 31.50  . Types: Cigarettes  Smokeless Tobacco Never Used    No Known Allergies Objective:    Vitals:   11/08/19 1501  BP: (!) 164/86   There is no height or weight on file to calculate BMI. Constitutional Well developed. Well nourished.  Vascular Dorsalis pedis pulses palpable bilaterally. Posterior tibial pulses palpable bilaterally. Capillary refill normal to all digits.  No cyanosis or clubbing noted. Pedal hair growth normal.  Neurologic Normal speech. Oriented to person, place, and time. Epicritic sensation to light touch grossly present bilaterally.  Dermatologic  left first and second interspace heloma molle/hyperkeratotic lesion noted.  Pain on palpation of this lesion. Left submet 3 hyperkeratotic lesion noted with nucleated core.  Pain on palpation to this lesion as well.  Orthopedic: Normal joint ROM without pain or crepitus bilaterally. No visible deformities. No bony tenderness.   Radiographs: None Assessment:   1. Heloma molle   2. Porokeratosis   3. Pain in left foot    Plan:  Patient was evaluated and treated and all questions answered.  Left foot porokeratosis -Using a chisel blade, the lesion was aggressively debrided down to the level of healthy striated tissue.  At this time Cantharone therapy was applied to allow for destruction of the lesion.  No complications noted at this time.  Left first second interspace heloma molle -Using chisel blade, the lesions were debrided down to healthy striated tissue.  No complication noted no pinpoint bleeding noted.  No follow-ups  on file.

## 2019-11-13 NOTE — Telephone Encounter (Signed)
I called pt and he states his foot hurts worse than when he came in Dr. Posey Pronto put on a chemical and said it was a plugged sweat gland and it doesn't look like he cut on it. I told pt that if he was worse he should come back in, until then he could use a non-medicated corn pad around the area, neosporin with lidocaine and cover with a bandaid. Pt states he will wait to see if that helped.

## 2019-11-13 NOTE — Telephone Encounter (Signed)
Unable to leave a message listed phone number was busy.

## 2019-11-13 NOTE — Telephone Encounter (Signed)
Pt began call, "is this the nurse that fixed my foot", and said he needed to discuss.

## 2019-11-27 DIAGNOSIS — J449 Chronic obstructive pulmonary disease, unspecified: Secondary | ICD-10-CM | POA: Diagnosis not present

## 2019-12-12 ENCOUNTER — Ambulatory Visit (INDEPENDENT_AMBULATORY_CARE_PROVIDER_SITE_OTHER): Payer: PPO

## 2019-12-12 ENCOUNTER — Ambulatory Visit (INDEPENDENT_AMBULATORY_CARE_PROVIDER_SITE_OTHER): Payer: PPO | Admitting: Podiatry

## 2019-12-12 ENCOUNTER — Other Ambulatory Visit: Payer: Self-pay

## 2019-12-12 DIAGNOSIS — Q828 Other specified congenital malformations of skin: Secondary | ICD-10-CM

## 2019-12-12 DIAGNOSIS — M2032 Hallux varus (acquired), left foot: Secondary | ICD-10-CM | POA: Diagnosis not present

## 2019-12-12 DIAGNOSIS — M21962 Unspecified acquired deformity of left lower leg: Secondary | ICD-10-CM

## 2019-12-12 NOTE — Patient Instructions (Signed)
Soak the feet in water and Epsom salt. Use a Pumice stone to soften the area.

## 2019-12-12 NOTE — Progress Notes (Signed)
  Subjective:  Patient ID: Edward Matthews, male    DOB: January 23, 1941,  MRN: [id number]  Chief Complaint  Patient presents with  . Plantar Warts    Pt states left plantar warts are worse since his last visit.     78 y.o. male presents with the above complaint. Hx as above.  Review of Systems: Negative except as noted in the HPI. Denies N/V/F/Ch.  Past Medical History:  Diagnosis Date  . COPD (chronic obstructive pulmonary disease) ([place])   . GERD (gastroesophageal reflux disease)   . Hypothyroidism     Current Outpatient Medications:  .  ADVAIR DISKUS 250-50 MCG/DOSE AEPB, Inhale 2 puffs into the lungs 2 (two) times daily. , Disp: , Rfl:  .  atorvastatin (LIPITOR) 20 MG tablet, Take 20 mg by mouth daily., Disp: , Rfl:  .  dexlansoprazole (DEXILANT) 60 MG capsule, Take 60 mg by mouth daily., Disp: , Rfl:  .  nicotine (NICODERM CQ - DOSED IN MG/24 HOURS) 21 mg/24hr patch, Place 1 patch (21 mg total) onto the skin daily., Disp: 28 patch, Rfl: 0 .  ofloxacin (FLOXIN) 0.3 % OTIC solution, SMARTSIG:10 Drop(s) In Ear(s) Every 12 Hours, Disp: , Rfl:  .  pravastatin (PRAVACHOL) 80 MG tablet, Take 80 mg by mouth daily., Disp: , Rfl:  .  SYNTHROID 125 MCG tablet, Take 125 mcg by mouth daily before breakfast. , Disp: , Rfl:   Social History   Tobacco Use  Smoking Status Current Every Day Smoker  . Packs/day: 0.50  . Years: 63.00  . Pack years: 31.50  . Types: Cigarettes  Smokeless Tobacco Never Used    No Known Allergies Objective:  There were no vitals filed for this visit. There is no height or weight on file to calculate BMI. Constitutional Well developed. Well nourished.  Vascular Dorsalis pedis pulses palpable bilaterally. Posterior tibial pulses palpable bilaterally. Capillary refill normal to all digits.  No cyanosis or clubbing noted. Pedal hair growth normal.  Neurologic Normal speech. Oriented to person, place, and time. Epicritic sensation to light touch grossly  present bilaterally.  Dermatologic Nails normal Skin punctate keratoses submet 3/4   Orthopedic: Normal joint ROM without pain or crepitus bilaterally. No visible deformities. No bony tenderness.   Radiographs: taken and reviewed elongated 3rd metatarsal corresponds to one area of the marker Assessment:   1. Metatarsal deformity, left   2. Porokeratosis   3. Hallux hammertoe, left    Plan:  Patient was evaluated and treated and all questions answered.  Hammertoe with Heloma Molle -Educated on etiology  -Discussed surgical intervention, patient not interested   Porokeratoses with metatarsal deformity -Debrided as below -Discussed surgical intervention, patient not interested  Procedure: Destruction of Lesion Location: plantar submet 3/4 Anesthesia: none Instrumentation: 15 blade. Technique: Debridement of lesion Small amount of salinocaine applied to the base of the lesion. Dressing: Dry, sterile, compression dressing. Disposition: Patient tolerated procedure well. Advised to leave dressing on for 6-8 hours. Thereafter patient to wash the area with soap and water and applied band-aid. Off-loading pads dispensed. Patient to return in 2 weeks for follow-up.   Return if symptoms worsen or fail to improve.

## 2019-12-28 DIAGNOSIS — J449 Chronic obstructive pulmonary disease, unspecified: Secondary | ICD-10-CM | POA: Diagnosis not present

## 2020-01-28 DIAGNOSIS — J449 Chronic obstructive pulmonary disease, unspecified: Secondary | ICD-10-CM | POA: Diagnosis not present

## 2020-02-25 DIAGNOSIS — J449 Chronic obstructive pulmonary disease, unspecified: Secondary | ICD-10-CM | POA: Diagnosis not present

## 2020-03-27 DIAGNOSIS — J449 Chronic obstructive pulmonary disease, unspecified: Secondary | ICD-10-CM | POA: Diagnosis not present

## 2020-04-26 DIAGNOSIS — J449 Chronic obstructive pulmonary disease, unspecified: Secondary | ICD-10-CM | POA: Diagnosis not present

## 2020-05-27 DIAGNOSIS — J449 Chronic obstructive pulmonary disease, unspecified: Secondary | ICD-10-CM | POA: Diagnosis not present

## 2020-06-08 DIAGNOSIS — E7800 Pure hypercholesterolemia, unspecified: Secondary | ICD-10-CM | POA: Diagnosis not present

## 2020-06-08 DIAGNOSIS — Z0000 Encounter for general adult medical examination without abnormal findings: Secondary | ICD-10-CM | POA: Diagnosis not present

## 2020-06-15 DIAGNOSIS — K219 Gastro-esophageal reflux disease without esophagitis: Secondary | ICD-10-CM | POA: Diagnosis not present

## 2020-06-15 DIAGNOSIS — Z720 Tobacco use: Secondary | ICD-10-CM | POA: Diagnosis not present

## 2020-06-15 DIAGNOSIS — J449 Chronic obstructive pulmonary disease, unspecified: Secondary | ICD-10-CM | POA: Diagnosis not present

## 2020-06-15 DIAGNOSIS — Z0000 Encounter for general adult medical examination without abnormal findings: Secondary | ICD-10-CM | POA: Diagnosis not present

## 2020-06-15 DIAGNOSIS — E039 Hypothyroidism, unspecified: Secondary | ICD-10-CM | POA: Diagnosis not present

## 2020-06-15 DIAGNOSIS — J849 Interstitial pulmonary disease, unspecified: Secondary | ICD-10-CM | POA: Diagnosis not present

## 2020-06-15 DIAGNOSIS — E7800 Pure hypercholesterolemia, unspecified: Secondary | ICD-10-CM | POA: Diagnosis not present

## 2020-06-15 DIAGNOSIS — I700 Atherosclerosis of aorta: Secondary | ICD-10-CM | POA: Diagnosis not present

## 2020-06-26 DIAGNOSIS — J449 Chronic obstructive pulmonary disease, unspecified: Secondary | ICD-10-CM | POA: Diagnosis not present

## 2020-07-21 ENCOUNTER — Other Ambulatory Visit: Payer: Self-pay

## 2020-07-27 DIAGNOSIS — J449 Chronic obstructive pulmonary disease, unspecified: Secondary | ICD-10-CM | POA: Diagnosis not present

## 2020-08-27 DIAGNOSIS — J449 Chronic obstructive pulmonary disease, unspecified: Secondary | ICD-10-CM | POA: Diagnosis not present

## 2020-09-26 DIAGNOSIS — J449 Chronic obstructive pulmonary disease, unspecified: Secondary | ICD-10-CM | POA: Diagnosis not present

## 2020-10-14 ENCOUNTER — Ambulatory Visit (HOSPITAL_COMMUNITY)
Admission: RE | Admit: 2020-10-14 | Discharge: 2020-10-14 | Disposition: A | Discharge status: Home / Self Care | Payer: Medicare Other | Source: Ambulatory Visit | Attending: Pulmonary Disease | Admitting: Pulmonary Disease

## 2020-10-14 ENCOUNTER — Other Ambulatory Visit: Payer: Self-pay | Admitting: Infectious Diseases

## 2020-10-14 ENCOUNTER — Telehealth: Payer: Self-pay | Admitting: Infectious Diseases

## 2020-10-14 DIAGNOSIS — U071 COVID-19: Secondary | ICD-10-CM | POA: Diagnosis present

## 2020-10-14 DIAGNOSIS — Z23 Encounter for immunization: Secondary | ICD-10-CM | POA: Insufficient documentation

## 2020-10-14 DIAGNOSIS — J9611 Chronic respiratory failure with hypoxia: Secondary | ICD-10-CM | POA: Insufficient documentation

## 2020-10-14 DIAGNOSIS — J439 Emphysema, unspecified: Secondary | ICD-10-CM

## 2020-10-14 MED ORDER — FAMOTIDINE IN NACL 20-0.9 MG/50ML-% IV SOLN: 20.0000 mg | Freq: Once | INTRAVENOUS | Status: DC | PRN

## 2020-10-14 MED ORDER — SODIUM CHLORIDE 0.9 % IV SOLN: Freq: Once | INTRAVENOUS | Status: AC

## 2020-10-14 MED ORDER — SODIUM CHLORIDE 0.9 % IV SOLN: INTRAVENOUS | Status: DC | PRN

## 2020-10-14 MED ORDER — ALBUTEROL SULFATE HFA 108 (90 BASE) MCG/ACT IN AERS: 2.0000 | INHALATION_SPRAY | Freq: Once | RESPIRATORY_TRACT | Status: DC | PRN

## 2020-10-14 MED ORDER — EPINEPHRINE 0.3 MG/0.3ML IJ SOAJ: 0.3000 mg | Freq: Once | INTRAMUSCULAR | Status: DC | PRN

## 2020-10-14 MED ORDER — DIPHENHYDRAMINE HCL 50 MG/ML IJ SOLN: 50.0000 mg | Freq: Once | INTRAMUSCULAR | Status: DC | PRN

## 2020-10-14 MED ORDER — METHYLPREDNISOLONE SODIUM SUCC 125 MG IJ SOLR: 125.0000 mg | Freq: Once | INTRAMUSCULAR | Status: DC | PRN

## 2020-10-14 NOTE — Telephone Encounter (Signed)
Called to Discuss with patient about Covid symptoms and the use of the monoclonal antibody infusion for those with mild to moderate Covid symptoms and at a high risk of hospitalization.     Pt appears to qualify for this infusion due to co-morbid conditions and/or a member of an at-risk group in accordance with the FDA Emergency Use Authorization.     Sx started 3-4 days ago  Qualifies for infusion with current everyday smoker, COPD (on home O2 @ 2.25 lpm which is stable).   Janene Madeira, MSN, NP-C [facility] for Infectious Disease [email or web address] Pager: [phone] Office: [phone] [place]: [phone]

## 2020-10-14 NOTE — Discharge Instructions (Signed)

## 2020-10-14 NOTE — Progress Notes (Signed)
I connected by phone with Edward Matthews on 10/14/2020 at 11:21 AM to discuss the potential use of a new treatment for mild to moderate COVID-19 viral infection in non-hospitalized patients.  This patient is a 79 y.o. male that meets the FDA criteria for Emergency Use Authorization of COVID monoclonal antibody casirivimab/imdevimab or bamlanivimab/eteseviamb.  Has a (+) direct SARS-CoV-2 viral test result  Has mild or moderate COVID-19   Is NOT hospitalized due to COVID-19  Is within 10 days of symptom onset  Has at least one of the high risk factor(s) for progression to severe COVID-19 and/or hospitalization as defined in EUA.  Specific high risk criteria : Older age (>/= 78 yo), Chronic Lung Disease and Medical-related technological dependence   I have spoken and communicated the following to the patient or parent/caregiver regarding COVID monoclonal antibody treatment:  1. FDA has authorized the emergency use for the treatment of mild to moderate COVID-19 in adults and pediatric patients with positive results of direct SARS-CoV-2 viral testing who are 66 years of age and older weighing at least 40 kg, and who are at high risk for progressing to severe COVID-19 and/or hospitalization.  2. The significant known and potential risks and benefits of COVID monoclonal antibody, and the extent to which such potential risks and benefits are unknown.  3. Information on available alternative treatments and the risks and benefits of those alternatives, including clinical trials.  4. Patients treated with COVID monoclonal antibody should continue to self-isolate and use infection control measures (e.g., wear mask, isolate, social distance, avoid sharing personal items, clean and disinfect "high touch" surfaces, and frequent handwashing) according to CDC guidelines.   5. The patient or parent/caregiver has the option to accept or refuse COVID monoclonal antibody treatment.  After reviewing  this information with the patient, the patient has agreed to receive one of the available covid 19 monoclonal antibodies and will be provided an appropriate fact sheet prior to infusion. Janene Madeira, NP 10/14/2020 11:21 AM

## 2020-10-14 NOTE — Progress Notes (Signed)
  Diagnosis: COVID-19  Physician: Dr Joya Gaskins  Procedure: Covid Infusion Clinic Med: bamlanivimab\etesevimab infusion - Provided patient with bamlanimivab\etesevimab fact sheet for patients, parents and caregivers prior to infusion.  Complications: No immediate complications noted.  Discharge: Discharged home   [place], [place] 10/14/2020

## 2020-10-16 ENCOUNTER — Telehealth (HOSPITAL_COMMUNITY): Payer: Self-pay | Admitting: Emergency Medicine

## 2020-10-22 DIAGNOSIS — R0600 Dyspnea, unspecified: Secondary | ICD-10-CM | POA: Diagnosis not present

## 2020-10-22 DIAGNOSIS — J90 Pleural effusion, not elsewhere classified: Secondary | ICD-10-CM | POA: Diagnosis not present

## 2020-10-22 DIAGNOSIS — R0602 Shortness of breath: Secondary | ICD-10-CM | POA: Diagnosis not present

## 2020-10-22 DIAGNOSIS — I700 Atherosclerosis of aorta: Secondary | ICD-10-CM | POA: Diagnosis not present

## 2020-10-22 DIAGNOSIS — R0902 Hypoxemia: Secondary | ICD-10-CM | POA: Diagnosis not present

## 2020-10-22 DIAGNOSIS — R64 Cachexia: Secondary | ICD-10-CM | POA: Diagnosis not present

## 2020-10-22 DIAGNOSIS — Z720 Tobacco use: Secondary | ICD-10-CM | POA: Diagnosis not present

## 2020-10-22 DIAGNOSIS — Z23 Encounter for immunization: Secondary | ICD-10-CM | POA: Diagnosis not present

## 2020-10-22 DIAGNOSIS — J449 Chronic obstructive pulmonary disease, unspecified: Secondary | ICD-10-CM | POA: Diagnosis not present

## 2020-10-22 DIAGNOSIS — R634 Abnormal weight loss: Secondary | ICD-10-CM | POA: Diagnosis not present

## 2020-10-22 DIAGNOSIS — J849 Interstitial pulmonary disease, unspecified: Secondary | ICD-10-CM | POA: Diagnosis not present

## 2020-10-22 DIAGNOSIS — E039 Hypothyroidism, unspecified: Secondary | ICD-10-CM | POA: Diagnosis not present

## 2020-10-22 DIAGNOSIS — J189 Pneumonia, unspecified organism: Secondary | ICD-10-CM | POA: Diagnosis not present

## 2020-10-22 DIAGNOSIS — I950 Idiopathic hypotension: Secondary | ICD-10-CM | POA: Diagnosis not present

## 2020-10-22 DIAGNOSIS — K219 Gastro-esophageal reflux disease without esophagitis: Secondary | ICD-10-CM | POA: Diagnosis not present

## 2020-10-27 ENCOUNTER — Telehealth: Payer: Self-pay

## 2020-10-27 DIAGNOSIS — J449 Chronic obstructive pulmonary disease, unspecified: Secondary | ICD-10-CM | POA: Diagnosis not present

## 2020-10-27 NOTE — Telephone Encounter (Signed)
3:20PM: Palliative care SW outreached patient.  LVM to scheduled initial visit with RN and SW. Awaiting return call.

## 2020-10-30 ENCOUNTER — Telehealth: Payer: Self-pay

## 2020-10-30 NOTE — Telephone Encounter (Signed)
4:45PM: Palliative care outreached patients on, Edward Matthews, to schedule in home visit.  Son shared that patient does not have any COVID symptoms and has had infusions. Home visit scheduled with SW and RN Mon 11/8 @1130 .

## 2020-11-02 ENCOUNTER — Other Ambulatory Visit: Payer: PPO

## 2020-11-02 ENCOUNTER — Other Ambulatory Visit: Payer: Self-pay

## 2020-11-02 VITALS — BP 120/70 | HR 56 | Resp 32 | Ht 67.0 in | Wt 133.8 lb

## 2020-11-02 DIAGNOSIS — Z515 Encounter for palliative care: Secondary | ICD-10-CM

## 2020-11-02 NOTE — Progress Notes (Signed)
PATIENT NAME: Edward Matthews DOB: [birth year] MRN: [id number]  PRIMARY CARE PROVIDER: [name], MD  RESPONSIBLE PARTY:  Acct ID - Guarantor Home Phone Work Phone Relationship Acct Type  [id number] Edward Matthews,* [phone]  Self P/F     [address], [place], [place] [address]    PLAN OF CARE and INTERVENTIONS:               1.  GOALS OF CARE/ ADVANCE CARE PLANNING:  Son desires to keep patient in the home and independent with assistance of brothers.               2.  PATIENT/CAREGIVER EDUCATION:  Unable to discuss with patient due to paranoia and confusion.               3.  DISEASE STATUS: Joint visit with [place], [place]. Greeted at the door by [employer].  Meet with son privately before meeting with patient.  Son is currently on leave from [employer] and will need to return to his new station at [employer] in the next week.  Son notes forgetfulness prior to COVID-19 dx but this has escalated since last month.   Son notes patient was planning on traveling to see his other son over the summer more often but he got lost on the way there.  Son believes patient may have dementia but he does not have a diagnosis.   Patient entered the living room during this visit.  He is very paranoid and is having delusional thinking. He believes son is moving items (leather wipes and a drinking cup) in the house to monitor him.  The phone rings and  states "call from [place]" and patient believes someone just pulled up in the front yard from [place].  Patient is holding the phone, pressing buttons but unable to use it.  He continues to hold the phone reading the messages and numbers on it.  Patient is very fixated on this for about 10 minutes.  Son attempts to remove the phone and place it back on the holder but patient will pick it back up after a few minutes until his son removes the phone from the room.   Patient picks up cup and states, "do you see this?" and then states "he got me".   Patient  believes his son is the cause of his breathing issues.   Son provided an inhaler to patient and he takes 2 puffs as directed.  Patient then proceeds to use the inhaler about 5 more times before son is able to take the inhaler from him.  Patient continues to rinse and gargle his mouth out multiple times.  Son has attempted to redirect patient but is unsuccessful.   Son notes this type of behavior has been going on for almost a month.  On rare occasions, patient will have a good day and be clear but son feels this is becoming more of the norm.  Attempted to discuss advance planning but patient is unable to proceed with this due to his current mental state.  Patient has been sleeping well with trazadone per son.  Lorazepam was tried recently but was not effective after the 1st night of use.  Patient does toilet himself but there has been some recent incontinence episodes that are new.  Patient is very short of breath with pursed lip while sitting.  Anxiety level is high and son confirms patient does have a history of this.  Patient doesn't currently utilize  any medications to address anxiety.   Given patient's current condition it is recommended that he do a follow up visit with PCP as soon as possible.  I have contacted PCP office and an appointment has been scheduled for tomorrow at 1030 am.  I have advised son that I would complete a follow up call tomorrow after visit is completed.   HISTORY OF PRESENT ILLNESS:    CODE STATUS: Full ADVANCED DIRECTIVES: N MOST FORM: No PPS: 50%   PHYSICAL EXAM:  79 year old male with dx of COPD.  He is being followed by Palliative Care monthly and PRN.  VITALS: Today's Vitals   11/02/20 1151  BP: 120/70  Pulse: (!) 56  Resp: (!) 32  SpO2: 90%  Weight: 133 lb 12.8 oz (60.7 kg)  Height: _0  (1.702 m)  PainSc: 0-No pain    LUNGS: CTA Pursed lip breathing.  Currently on O2 at 3 L via [place]. CARDIAC: HRR EXTREMITIES: No edema present. SKIN: Warm and  dry to touch. NEURO: Alert to self and son.  Forgetfulness present..  Time: 5883-2549 pm       Lorenza Burton, RN

## 2020-11-02 NOTE — Progress Notes (Signed)
COMMUNITY PALLIATIVE CARE SW NOTE  PATIENT NAME: Edward Matthews DOB: March 11, 1941 MRN: [id number]  PRIMARY CARE PROVIDER: [name], MD  RESPONSIBLE PARTY:  Acct ID - Guarantor Home Phone Work Phone Relationship Acct Type  [id number] Edward Matthews,* [id number]  Self P/F     [address], [place], [place] [address]     PLAN OF CARE and INTERVENTIONS:              GOALS OF CARE/ ADVANCE CARE PLANNING:  Patient is a FULL CODE. Patient does not have ACP in place. Son encouraged to discuss further with family and patient if appropriate. 2. SOCIAL/EMOTIONAL/SPIRITUAL ASSESSMENT/ INTERVENTIONS:  SW and RN Edward Matthews met with patient and patient's son Edward Matthews. Patient lives in a one story home independently. Patient has symptoms of dementia, son is unsure. Son updated SW and RN medical condition and changes. Son shared that patient recently recovered from [place]. Son is primary caregiver at this time but is scheduled to return to his Edward Matthews duty station within the next week. Patient sleeps well and naps during the day. No falls noted. Patient eats well and has gained his weight back form what he lost with the COVID dx. Patient currently on 3LPM continuous and sometimes bumps up to 3.5. RN reviewed medications and vitals. Son concerned about patients mental status, shares that patient has been exhibiting some delirium and dementia symptoms. SW and RN was able to interact with patient and witness behaviors of paranoia and delirium. SW discussed goals, reviewed care plan, provided emotional support, used active and reflective listening. Palliative care will continue to monitor and assist with long term care planning as needed.  3. PATIENT/CAREGIVER EDUCATION/ COPING:  Patient was alert, but confused, fixated on things on the table and exhibiting some delirium and hallucinations. Patient able to respond to simple yes/no questions when asked. Patient has symptoms of anxiety as well and not taking anything for it the  symptoms. Son is attempting to cope with patients behaviors but is need of answers/assistance with patients symptoms. Patient has 4 sons whom live locally and are supportive. 4. PERSONAL EMERGENCY PLAN:  Family will call 9-1-1 for emergencies.  5. COMMUNITY RESOURCES COORDINATION/ HEALTH CARE NAVIGATION: Son provides all care coordination. RN scheduled visit with PCP Dr. Maudie Matthews for [place] 11/9 _0 . Patient can benefit from [facility] services in the home as well to assist patient and family. 6. FINANCIAL/LEGAL CONCERNS/INTERVENTIONS:  None.     SOCIAL HX:  Social History   Tobacco Use  . Smoking status: Current Every Day Smoker    Packs/day: 0.50    Years: 63.00    Pack years: 31.50    Types: Cigarettes  . Smokeless tobacco: Never Used  Substance Use Topics  . Alcohol use: Not on file    CODE STATUS: Full code  ADVANCED DIRECTIVES: N MOST FORM COMPLETE: N HOSPICE EDUCATION PROVIDED: N  PPS: Patient is ambulatory W/o AD and able to toilet I. Patient able to bathe with assistance. Son does meal prep.   Time spent: 1hr 20 min.       [name], [place]

## 2020-11-03 DIAGNOSIS — Z23 Encounter for immunization: Secondary | ICD-10-CM | POA: Diagnosis not present

## 2020-11-03 DIAGNOSIS — R413 Other amnesia: Secondary | ICD-10-CM | POA: Diagnosis not present

## 2020-11-03 DIAGNOSIS — R410 Disorientation, unspecified: Secondary | ICD-10-CM | POA: Diagnosis not present

## 2020-11-04 ENCOUNTER — Telehealth: Payer: Self-pay

## 2020-11-04 NOTE — Telephone Encounter (Signed)
308 pm.  Follow up call made to son [name] after visit with PA this am.  [name] states patient was doing well today and has been clear.  PA was unable to see the mental status changes that PC observed on yesterday's visit so no medication changes were made.  A referral has been made for neurology but [name] states that it might be a few weeks before patient can be seen.  Per [name], [place] is agreeable to complete PCS papers but will need a copy.  Advised that I would contact [place], SW to sent PCS paperwork to PA.  No other concerns voiced at this time.

## 2020-11-06 ENCOUNTER — Telehealth: Payer: Self-pay

## 2020-11-06 NOTE — Telephone Encounter (Signed)
10:50AM: Palliative care SW returned patients son, Edward Matthews, phone call.   Son stated that patient had appointment with PA Vassie Moment at Dr. Julianne Rice office on 11/9 to address patients behaviors, however on that day patient was fine and not exhibiting any behaviors. A neuro consult was made. On today son states that patient was fine and normal for 2 days but is now back to having delusional thought process and hallucinations. SW informed son that PCS application was faxed to [place] on Wed 11/10 for completion and signature. Son shares that his brother will be coming to be with patient in his home due to him having to return back to the [employer].

## 2020-11-10 NOTE — Progress Notes (Signed)
10:40AM: Palliative care SW re-faxed PCS application to patients PCP office for completion and signature.

## 2020-11-12 ENCOUNTER — Telehealth: Payer: Self-pay

## 2020-11-12 NOTE — Telephone Encounter (Signed)
[id number]: Patients son, Deatra Ina palliative care SW to inquire of progression of PCS application. SW advised son that the application had been sent tp PCP office and awaiting for it to be returned to SW or PCP office can fax directly to [facility] program.   Son also share that he feels patients mental capacity Is getting worse and that it appears to be more psychosis symptoms (stating that son is apart of a agency out to get him, that there are people or objects in the home that are not, etc). SW advised son that he and his family feel as though patients safety is at rick due to these delusions and hallucinations then they can take him to the ER for a psychiatric evaluation. Son shared that  Patient has a neuro appt Jan 27th. SW advised son to outreach PCP office to receive feedback and recommendations from PA as well.

## 2020-11-16 ENCOUNTER — Emergency Department (HOSPITAL_COMMUNITY): Payer: PPO

## 2020-11-16 ENCOUNTER — Encounter (HOSPITAL_COMMUNITY): Payer: Self-pay

## 2020-11-16 ENCOUNTER — Other Ambulatory Visit: Payer: Self-pay

## 2020-11-16 ENCOUNTER — Emergency Department (HOSPITAL_COMMUNITY)
Admission: EM | Admit: 2020-11-16 | Discharge: 2020-11-19 | Disposition: A | Discharge status: Home / Self Care | Payer: PPO | Attending: Emergency Medicine | Admitting: Emergency Medicine

## 2020-11-16 ENCOUNTER — Telehealth: Payer: Self-pay

## 2020-11-16 DIAGNOSIS — F29 Unspecified psychosis not due to a substance or known physiological condition: Secondary | ICD-10-CM | POA: Diagnosis not present

## 2020-11-16 DIAGNOSIS — J449 Chronic obstructive pulmonary disease, unspecified: Secondary | ICD-10-CM | POA: Insufficient documentation

## 2020-11-16 DIAGNOSIS — E039 Hypothyroidism, unspecified: Secondary | ICD-10-CM | POA: Insufficient documentation

## 2020-11-16 DIAGNOSIS — F0390 Unspecified dementia without behavioral disturbance: Secondary | ICD-10-CM | POA: Insufficient documentation

## 2020-11-16 DIAGNOSIS — Z79899 Other long term (current) drug therapy: Secondary | ICD-10-CM | POA: Insufficient documentation

## 2020-11-16 DIAGNOSIS — Z20822 Contact with and (suspected) exposure to covid-19: Secondary | ICD-10-CM | POA: Diagnosis not present

## 2020-11-16 DIAGNOSIS — F17210 Nicotine dependence, cigarettes, uncomplicated: Secondary | ICD-10-CM | POA: Insufficient documentation

## 2020-11-16 DIAGNOSIS — F22 Delusional disorders: Secondary | ICD-10-CM | POA: Diagnosis not present

## 2020-11-16 DIAGNOSIS — F600 Paranoid personality disorder: Secondary | ICD-10-CM | POA: Diagnosis not present

## 2020-11-16 DIAGNOSIS — J811 Chronic pulmonary edema: Secondary | ICD-10-CM | POA: Diagnosis not present

## 2020-11-16 DIAGNOSIS — R9431 Abnormal electrocardiogram [ECG] [EKG]: Secondary | ICD-10-CM | POA: Diagnosis not present

## 2020-11-16 DIAGNOSIS — R059 Cough, unspecified: Secondary | ICD-10-CM | POA: Diagnosis not present

## 2020-11-16 DIAGNOSIS — Z9981 Dependence on supplemental oxygen: Secondary | ICD-10-CM | POA: Diagnosis not present

## 2020-11-16 DIAGNOSIS — C3490 Malignant neoplasm of unspecified part of unspecified bronchus or lung: Secondary | ICD-10-CM | POA: Diagnosis not present

## 2020-11-16 DIAGNOSIS — R4182 Altered mental status, unspecified: Secondary | ICD-10-CM | POA: Diagnosis not present

## 2020-11-16 DIAGNOSIS — G9389 Other specified disorders of brain: Secondary | ICD-10-CM | POA: Diagnosis not present

## 2020-11-16 LAB — CBC WITH DIFFERENTIAL/PLATELET
Abs Immature Granulocytes: 0.04 10*3/uL (ref 0.00–0.07)
Basophils Absolute: 0 10*3/uL (ref 0.0–0.1)
Basophils Relative: 0 %
Eosinophils Absolute: 0.1 10*3/uL (ref 0.0–0.5)
Eosinophils Relative: 1 %
HCT: 37.4 % — ABNORMAL LOW (ref 39.0–52.0)
Hemoglobin: 12.4 g/dL — ABNORMAL LOW (ref 13.0–17.0)
Immature Granulocytes: 0 %
Lymphocytes Relative: 18 %
Lymphs Abs: 1.7 10*3/uL (ref 0.7–4.0)
MCH: 31.6 pg (ref 26.0–34.0)
MCHC: 33.2 g/dL (ref 30.0–36.0)
MCV: 95.4 fL (ref 80.0–100.0)
Monocytes Absolute: 0.7 10*3/uL (ref 0.1–1.0)
Monocytes Relative: 8 %
Neutro Abs: 7 10*3/uL (ref 1.7–7.7)
Neutrophils Relative %: 73 %
Platelets: 191 10*3/uL (ref 150–400)
RBC: 3.92 MIL/uL — ABNORMAL LOW (ref 4.22–5.81)
RDW: 13.1 % (ref 11.5–15.5)
WBC: 9.6 10*3/uL (ref 4.0–10.5)
nRBC: 0 % (ref 0.0–0.2)

## 2020-11-16 LAB — RESP PANEL BY RT-PCR (FLU A&B, COVID) ARPGX2
Influenza A by PCR: NEGATIVE
Influenza B by PCR: NEGATIVE
SARS Coronavirus 2 by RT PCR: NEGATIVE

## 2020-11-16 LAB — URINALYSIS, ROUTINE W REFLEX MICROSCOPIC
Bilirubin Urine: NEGATIVE
Glucose, UA: NEGATIVE mg/dL
Hgb urine dipstick: NEGATIVE
Ketones, ur: NEGATIVE mg/dL
Leukocytes,Ua: NEGATIVE
Nitrite: NEGATIVE
Protein, ur: NEGATIVE mg/dL
Specific Gravity, Urine: 1.018 (ref 1.005–1.030)
pH: 7 (ref 5.0–8.0)

## 2020-11-16 LAB — COMPREHENSIVE METABOLIC PANEL
ALT: 17 U/L (ref 0–44)
AST: 15 U/L (ref 15–41)
Albumin: 3.9 g/dL (ref 3.5–5.0)
Alkaline Phosphatase: 65 U/L (ref 38–126)
Anion gap: 7 (ref 5–15)
BUN: 22 mg/dL (ref 8–23)
CO2: 26 mmol/L (ref 22–32)
Calcium: 8.5 mg/dL — ABNORMAL LOW (ref 8.9–10.3)
Chloride: 106 mmol/L (ref 98–111)
Creatinine, Ser: 0.75 mg/dL (ref 0.61–1.24)
GFR, Estimated: >60 mL/min (ref >60)
Glucose, Bld: 102 mg/dL — ABNORMAL HIGH (ref 70–99)
Potassium: 3.4 mmol/L — ABNORMAL LOW (ref 3.5–5.1)
Sodium: 139 mmol/L (ref 135–145)
Total Bilirubin: 0.5 mg/dL (ref 0.3–1.2)
Total Protein: 6.9 g/dL (ref 6.5–8.1)

## 2020-11-16 LAB — RAPID URINE DRUG SCREEN, HOSP PERFORMED
Amphetamines: NOT DETECTED
Barbiturates: NOT DETECTED
Benzodiazepines: NOT DETECTED
Cocaine: NOT DETECTED
Opiates: NOT DETECTED
Tetrahydrocannabinol: NOT DETECTED

## 2020-11-16 LAB — ETHANOL: Alcohol, Ethyl (B): <10 mg/dL (ref <10)

## 2020-11-16 MED ORDER — ADULT MULTIVITAMIN W/MINERALS CH
1.0000 | ORAL_TABLET | Freq: Every day | ORAL | Status: DC
  Administered 2020-11-17 – 2020-11-19 (×3): 1 via ORAL
  Filled 2020-11-16 (×3): qty 1

## 2020-11-16 MED ORDER — ALUM & MAG HYDROXIDE-SIMETH 200-200-20 MG/5ML PO SUSP: 30.0000 mL | Freq: Four times a day (QID) | ORAL | Status: DC | PRN

## 2020-11-16 MED ORDER — PREDNISONE 50 MG PO TABS: 50.0000 mg | ORAL_TABLET | Freq: Every day | ORAL | Status: DC

## 2020-11-16 MED ORDER — ACETAMINOPHEN 325 MG PO TABS
650.0000 mg | ORAL_TABLET | ORAL | Status: DC | PRN
  Administered 2020-11-17: 650 mg via ORAL
  Filled 2020-11-16: qty 2

## 2020-11-16 MED ORDER — MOMETASONE FURO-FORMOTEROL FUM 200-5 MCG/ACT IN AERO
2.0000 | INHALATION_SPRAY | Freq: Two times a day (BID) | RESPIRATORY_TRACT | Status: DC
  Administered 2020-11-17 – 2020-11-19 (×4): 2 via RESPIRATORY_TRACT
  Filled 2020-11-16 (×2): qty 8.8

## 2020-11-16 MED ORDER — ONDANSETRON HCL 4 MG PO TABS: 4.0000 mg | ORAL_TABLET | Freq: Three times a day (TID) | ORAL | Status: DC | PRN

## 2020-11-16 MED ORDER — DOXYCYCLINE HYCLATE 100 MG PO TABS
100.0000 mg | ORAL_TABLET | Freq: Two times a day (BID) | ORAL | Status: DC
  Administered 2020-11-17 – 2020-11-19 (×5): 100 mg via ORAL
  Filled 2020-11-16 (×7): qty 1

## 2020-11-16 MED ORDER — LEVOTHYROXINE SODIUM 50 MCG PO TABS
150.0000 ug | ORAL_TABLET | Freq: Every day | ORAL | Status: DC
  Administered 2020-11-17 – 2020-11-19 (×3): 150 ug via ORAL
  Filled 2020-11-16 (×3): qty 1

## 2020-11-16 MED ORDER — ALBUTEROL SULFATE HFA 108 (90 BASE) MCG/ACT IN AERS: 1.0000 | INHALATION_SPRAY | Freq: Four times a day (QID) | RESPIRATORY_TRACT | Status: DC | PRN

## 2020-11-16 MED ORDER — PANTOPRAZOLE SODIUM 40 MG PO TBEC
40.0000 mg | DELAYED_RELEASE_TABLET | Freq: Every day | ORAL | Status: DC
  Administered 2020-11-17 – 2020-11-19 (×3): 40 mg via ORAL
  Filled 2020-11-16 (×3): qty 1

## 2020-11-16 NOTE — ED Notes (Signed)
Pt informed RN that he is not crazy, his son is out to get him and he (the pt) will not stand for it. Pt refused scheduled meds. Pt stated he is not taking any meds right now.

## 2020-11-16 NOTE — BH Assessment (Signed)
Clinician called the Tele-Assessment machine several times (at 2103 and at 2122) in an attempt to complete pt's [place] Assessment but there was no answer. TTS will attempt pt's assessment at a later time.

## 2020-11-16 NOTE — ED Notes (Signed)
Bed alarm has been placed and activated for pt's safety.
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
## 2020-11-16 NOTE — ED Notes (Addendum)
IVC paperwork dropped off by [employer]. Paperwork state pt has a mental illness and is dangerous to self or others, paranoia. Pt denies SI and HI at this time. Dr. Tomi Bamberger aware
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
## 2020-11-16 NOTE — Telephone Encounter (Signed)
3:30PM: Palliative care SW returned patients son, [name], phone call.  SW left HIPPA compliant VM. Awaiting return call.

## 2020-11-16 NOTE — ED Notes (Signed)
Gave pt 2 [place] sandwiches and orange juice.

## 2020-11-16 NOTE — ED Triage Notes (Signed)
Pt BIB EMS from doctors office. Pt is paranoid x2 months (per pt son), c/o "someone setting traps in my house". Pt son, [name], is the best family contact. Pt refused oxygen at first with EMS, stating their oxygen tank was poison. Pt did eventally allow EMS to put O2 on. Pt on 3L [place] with EMS, pt uses 2L [place] O2 at baseline.  Temp 101.7

## 2020-11-17 DIAGNOSIS — F22 Delusional disorders: Secondary | ICD-10-CM | POA: Diagnosis not present

## 2020-11-17 NOTE — ED Notes (Signed)
Pt has been quiet and cooperative throughout the day today. Pt independent with ADLs. Pt is still paranoid.

## 2020-11-17 NOTE — BH Assessment (Addendum)
[facility] Assessment Progress Note  Per Lindon Romp, NP, this pt requires psychiatric hospitalization at a facility providing specialty geriatric care this time.  Pt presents under IVC initiated by pt's son and upheld by Janie Morning, DO.  The following facilities have been contacted to seek placement for this pt, with results as noted:  Beds available, information sent, decision pending: [facility]: [facility] (due to lack of criteria) [facility] (O2 is exclusionary)  Unable to reach: [facility] (left message at 14:43)  Not referred: [facility] (dementia is exclusionary) [facility] (geriatric unit is currently closed) [facility] (geriatric unit is currently closed) [facility] (dementia is exclusionary)  At capacity: [place], [place] Coordinator [phone]

## 2020-11-17 NOTE — ED Notes (Signed)
Pt eating dinner. No complaints at this time.

## 2020-11-17 NOTE — ED Notes (Signed)
Pt changed into wine colored scrubs, wanded by security, belongings in 2 white bags placed in the cabinet in 19-22. He has some valuables that were locked in a lockbox by security. The lockbox key was placed in the IVC paperwork orange folder.

## 2020-11-17 NOTE — Progress Notes (Signed)
Manufacturing engineer [facility]) Community Based Palliative Care  This patient is enrolled in our palliative care services in the community. [facility] will continue to follow for any discharge planning needs and to coordinate continuation of palliative care.  If you have questions or need assistance, please call [phone] or contact the hospital Liaison listed on AMION.  Thank you for the opportunity to participate in this patient's care.  Margaretmary Eddy, BSN, RN [facility] Liaison [phone]
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
## 2020-11-17 NOTE — ED Notes (Signed)
Pt resting comfortably in room. Even chest rise and fall.

## 2020-11-18 ENCOUNTER — Encounter (HOSPITAL_COMMUNITY): Payer: Self-pay | Admitting: Registered Nurse

## 2020-11-18 DIAGNOSIS — J449 Chronic obstructive pulmonary disease, unspecified: Secondary | ICD-10-CM | POA: Diagnosis not present

## 2020-11-18 DIAGNOSIS — F600 Paranoid personality disorder: Secondary | ICD-10-CM | POA: Diagnosis not present

## 2020-11-18 DIAGNOSIS — F22 Delusional disorders: Secondary | ICD-10-CM | POA: Insufficient documentation

## 2020-11-18 LAB — RESP PANEL BY RT-PCR (FLU A&B, COVID) ARPGX2
Influenza A by PCR: NEGATIVE
Influenza B by PCR: NEGATIVE
SARS Coronavirus 2 by RT PCR: NEGATIVE

## 2020-11-18 MED ORDER — OLANZAPINE 5 MG PO TBDP
5.0000 mg | ORAL_TABLET | Freq: Every day | ORAL | Status: DC
  Administered 2020-11-18 – 2020-11-19 (×2): 5 mg via ORAL
  Filled 2020-11-18 (×2): qty 1

## 2020-11-18 NOTE — Progress Notes (Signed)
CSW received a call from [place] at [facility] stating the patient has been offered a bed and has been accepted and that the pt can arrive on 11/18/20 anytime after report is called at 7am on 11/25 once the results of the updated COVID test are received.  The pt's accepting doctor is Dr. Ronnald Ramp.  The room number will be 405.  The number for report is [phone] and please ask for [facility] after 7am and after the new covid result is faxed to fax: [phone]  Of note: Boykin Nearing is requesting another COVID test in case the pt's transport falls through on 11/25 due to Thanksgiving and the 3-day deadline for the previous COVID test has expired so they can hold the bed past thanksgiving if needed.  RN updated and will call the provider to request another COVID test.  CSW will update RN and EDP.  Alphonse Guild. Rumi Taras, Latanya Presser, LCAS Clinical Social Worker Ph: [phone]

## 2020-11-18 NOTE — ED Notes (Signed)
Provider @ bedside via telepsych machine 
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
## 2020-11-18 NOTE — BH Assessment (Signed)
[facility] Assessment Progress Note  Per Shuvon Rankin, NP, this involuntary pt continues to require psychiatric hospitalization at this time.  The following facilities have been contacted to seek placement for this pt, with results as noted:  Beds available, information sent, decision pending: [facility] (referred yesterday, under consideration today)  Declined: [facility] (reason unspecified)  Unable to reach: [place] (left message at 12:30)   At capacity: [place], [place] Coordinator [phone]

## 2020-11-18 NOTE — ED Provider Notes (Signed)
Emergency Medicine Observation Re-evaluation Note  Edward Matthews is a 79 y.o. male, seen on rounds today.  Pt initially presented to the ED for complaints of Paranoid, Fever, and IVC Currently, the patient is awaiting further recommendations by psychiatry for management..  Physical Exam  BP 117/89 (BP Location: Left Wrist)    Pulse (!) 50    Temp 97.7 F (36.5 C) (Oral)    Resp (!) 22    SpO2 93%  Physical Exam General: Resting comfortably no distress Cardiac: No murmur, no chest tenderness Lungs: Lungs clear, breathing normally Psych: Just ate breakfast and has no complaints  ED Course / MDM  EKG:EKG Interpretation  Date/Time:  Monday November 16 2020 17:22:43 EST Ventricular Rate:  55 PR Interval:    QRS Duration: 98 QT Interval:  432 QTC Calculation: 414 R Axis:   62 Text Interpretation: Sinus rhythm Prolonged PR interval Anteroseptal infarct, age indeterminate No significant change since last tracing Confirmed by Dorie Rank [phone]) on 11/16/2020 5:31:23 PM  Clinical Course as of Nov 18 944  Mon Nov 16, 2020  1948 Head CT without acute findings   [JK]  1948 Chest x-ray shows bibasilar airspace opacities.  Could be combination of atelectasis or infection.   [JK]  1948 Patient's breathing easily.  No fever.  He does have a normal white count.  I will cover him with antibiotics empirically as he is waiting for his psychiatric evaluation.  Patient is medically cleared   [JK]    Clinical Course User Index [JK] Dorie Rank, MD   I have reviewed the labs performed to date as well as medications administered while in observation.  Recent changes in the last 24 hours include none.  Plan  Current plan is for psychiatric management recommendations. Patient is under full IVC at this time.   Edward Matthews, Edward Allegra, MD 11/18/20 [phone]

## 2020-11-18 NOTE — Progress Notes (Signed)
CSW was updated by pt's RN that an updated COVID test was sent to the lab.  Of note: Edward Matthews is requesting another COVID test in case the pt's transport falls through on 11/25 due to Thanksgiving and the 3-day deadline for the previous COVID test has expired so they can hold the bed past thanksgiving if needed.  CSW will continue to follow for D/C needs.  Alphonse Guild. Daimen Shovlin  MSW, LCSW, LCAS, CCS Transitions of Care Clinical Social Worker Care Coordination Department Ph: [phone]

## 2020-11-19 DIAGNOSIS — E039 Hypothyroidism, unspecified: Secondary | ICD-10-CM | POA: Diagnosis not present

## 2020-11-19 DIAGNOSIS — Z9981 Dependence on supplemental oxygen: Secondary | ICD-10-CM | POA: Diagnosis not present

## 2020-11-19 DIAGNOSIS — F0391 Unspecified dementia with behavioral disturbance: Secondary | ICD-10-CM | POA: Diagnosis not present

## 2020-11-19 DIAGNOSIS — J441 Chronic obstructive pulmonary disease with (acute) exacerbation: Secondary | ICD-10-CM | POA: Diagnosis not present

## 2020-11-19 DIAGNOSIS — K5900 Constipation, unspecified: Secondary | ICD-10-CM | POA: Diagnosis not present

## 2020-11-19 DIAGNOSIS — F22 Delusional disorders: Secondary | ICD-10-CM | POA: Diagnosis not present

## 2020-11-19 DIAGNOSIS — J438 Other emphysema: Secondary | ICD-10-CM | POA: Diagnosis not present

## 2020-11-19 DIAGNOSIS — J449 Chronic obstructive pulmonary disease, unspecified: Secondary | ICD-10-CM | POA: Diagnosis not present

## 2020-11-19 DIAGNOSIS — K219 Gastro-esophageal reflux disease without esophagitis: Secondary | ICD-10-CM | POA: Diagnosis not present

## 2020-11-19 DIAGNOSIS — Z79899 Other long term (current) drug therapy: Secondary | ICD-10-CM | POA: Diagnosis not present

## 2020-11-19 DIAGNOSIS — F600 Paranoid personality disorder: Secondary | ICD-10-CM | POA: Diagnosis not present

## 2020-11-19 DIAGNOSIS — J9621 Acute and chronic respiratory failure with hypoxia: Secondary | ICD-10-CM | POA: Diagnosis not present

## 2020-11-19 DIAGNOSIS — Z87891 Personal history of nicotine dependence: Secondary | ICD-10-CM | POA: Diagnosis not present

## 2020-11-19 DIAGNOSIS — E785 Hyperlipidemia, unspecified: Secondary | ICD-10-CM | POA: Diagnosis not present

## 2020-11-19 DIAGNOSIS — J189 Pneumonia, unspecified organism: Secondary | ICD-10-CM | POA: Diagnosis not present

## 2020-11-19 DIAGNOSIS — J439 Emphysema, unspecified: Secondary | ICD-10-CM | POA: Diagnosis not present

## 2020-11-19 NOTE — ED Notes (Signed)
Called [employer] [phone] for IVC transport. I called twice. Each time, I received a recorded message for "Sgt Manning Charity, transportation." Left message for him to call me back regarding transportation for patient.

## 2020-11-19 NOTE — ED Notes (Addendum)
Gave report to [place] C at [place].

## 2020-11-19 NOTE — ED Notes (Signed)
Pt given a toothbrush, toothpaste, hair comb and deodorant. Will continue to monitor pt.

## 2020-11-19 NOTE — ED Notes (Signed)
Sgt called back. Michela Pitcher he will transport pt. Michela Pitcher he will call when he is 20 minutes away.

## 2020-11-19 NOTE — ED Notes (Signed)
Pt.s breakfast tray has been sit up. Pt sitting up and eating his breakfast. Will continue to monitor pt.

## 2020-11-19 NOTE — ED Provider Notes (Signed)
Emergency Medicine Observation Re-evaluation Note  Edward Matthews is a 79 y.o. male, seen on rounds today.  Pt initially presented to the ED for complaints of Paranoid, Fever, and IVC Currently, the patient is awaiting geriatric psych placement.  Physical Exam  BP 114/64 (BP Location: Left Arm)   Pulse (!) 55   Temp 97.7 F (36.5 C) (Oral)   Resp 16   SpO2 94%  Physical Exam General: resting  Cardiac: rrr Lungs: cta Psych: eating breakfast  ED Course / MDM  EKG:EKG Interpretation  Date/Time:  Monday November 16 2020 17:22:43 EST Ventricular Rate:  55 PR Interval:    QRS Duration: 98 QT Interval:  432 QTC Calculation: 414 R Axis:   62 Text Interpretation: Sinus rhythm Prolonged PR interval Anteroseptal infarct, age indeterminate No significant change since last tracing Confirmed by Dorie Rank [phone]) on 11/16/2020 5:31:23 PM  Clinical Course as of Nov 19 834  Mon Nov 16, 2020  1948 Head CT without acute findings   [JK]  1948 Chest x-Edward Matthews shows bibasilar airspace opacities.  Could be combination of atelectasis or infection.   [JK]  1948 Patient's breathing easily.  No fever.  He does have a normal white count.  I will cover him with antibiotics empirically as he is waiting for his psychiatric evaluation.  Patient is medically cleared   [JK]    Clinical Course User Index [JK] Dorie Rank, MD   I have reviewed the labs performed to date as well as medications administered while in observation.  Recent changes in the last 24 hours include none.  Plan  Current plan is for patient has been accepted in transfer by Dr. Ronnald Ramp to [place]. Patient is under full IVC at this time.   Pattricia Boss, MD 11/19/20 [phone]

## 2020-11-20 DIAGNOSIS — E039 Hypothyroidism, unspecified: Secondary | ICD-10-CM | POA: Diagnosis not present

## 2020-11-20 DIAGNOSIS — F0391 Unspecified dementia with behavioral disturbance: Secondary | ICD-10-CM | POA: Diagnosis not present

## 2020-11-20 DIAGNOSIS — K219 Gastro-esophageal reflux disease without esophagitis: Secondary | ICD-10-CM | POA: Diagnosis not present

## 2020-11-20 DIAGNOSIS — Z9981 Dependence on supplemental oxygen: Secondary | ICD-10-CM | POA: Diagnosis not present

## 2020-11-20 DIAGNOSIS — J438 Other emphysema: Secondary | ICD-10-CM | POA: Diagnosis not present

## 2020-11-20 DIAGNOSIS — J441 Chronic obstructive pulmonary disease with (acute) exacerbation: Secondary | ICD-10-CM | POA: Diagnosis not present

## 2020-11-21 DIAGNOSIS — F0391 Unspecified dementia with behavioral disturbance: Secondary | ICD-10-CM | POA: Diagnosis not present

## 2020-11-22 DIAGNOSIS — E039 Hypothyroidism, unspecified: Secondary | ICD-10-CM | POA: Diagnosis not present

## 2020-11-22 DIAGNOSIS — J441 Chronic obstructive pulmonary disease with (acute) exacerbation: Secondary | ICD-10-CM | POA: Diagnosis not present

## 2020-11-22 DIAGNOSIS — J438 Other emphysema: Secondary | ICD-10-CM | POA: Diagnosis not present

## 2020-11-22 DIAGNOSIS — F0391 Unspecified dementia with behavioral disturbance: Secondary | ICD-10-CM | POA: Diagnosis not present

## 2020-11-22 DIAGNOSIS — Z9981 Dependence on supplemental oxygen: Secondary | ICD-10-CM | POA: Diagnosis not present

## 2020-11-22 DIAGNOSIS — K219 Gastro-esophageal reflux disease without esophagitis: Secondary | ICD-10-CM | POA: Diagnosis not present

## 2020-11-23 DIAGNOSIS — J438 Other emphysema: Secondary | ICD-10-CM | POA: Diagnosis not present

## 2020-11-23 DIAGNOSIS — K219 Gastro-esophageal reflux disease without esophagitis: Secondary | ICD-10-CM | POA: Diagnosis not present

## 2020-11-23 DIAGNOSIS — E785 Hyperlipidemia, unspecified: Secondary | ICD-10-CM | POA: Diagnosis not present

## 2020-11-23 DIAGNOSIS — F0391 Unspecified dementia with behavioral disturbance: Secondary | ICD-10-CM | POA: Diagnosis not present

## 2020-11-23 DIAGNOSIS — E039 Hypothyroidism, unspecified: Secondary | ICD-10-CM | POA: Diagnosis not present

## 2020-11-23 DIAGNOSIS — Z9981 Dependence on supplemental oxygen: Secondary | ICD-10-CM | POA: Diagnosis not present

## 2020-11-24 ENCOUNTER — Telehealth: Payer: Self-pay

## 2020-11-24 DIAGNOSIS — Z9981 Dependence on supplemental oxygen: Secondary | ICD-10-CM | POA: Diagnosis not present

## 2020-11-24 DIAGNOSIS — E039 Hypothyroidism, unspecified: Secondary | ICD-10-CM | POA: Diagnosis not present

## 2020-11-24 DIAGNOSIS — F0391 Unspecified dementia with behavioral disturbance: Secondary | ICD-10-CM | POA: Diagnosis not present

## 2020-11-24 DIAGNOSIS — E785 Hyperlipidemia, unspecified: Secondary | ICD-10-CM | POA: Diagnosis not present

## 2020-11-24 DIAGNOSIS — J438 Other emphysema: Secondary | ICD-10-CM | POA: Diagnosis not present

## 2020-11-24 DIAGNOSIS — K219 Gastro-esophageal reflux disease without esophagitis: Secondary | ICD-10-CM | POA: Diagnosis not present

## 2020-11-24 NOTE — Telephone Encounter (Signed)
1030AM: Patients son, [name] , outreach palliative care SW in regard to patient PCS application. Son called [employer] coporation and was told that they had not received anything for patient.   SW called and left vm for PCP CMA, Mikeal Hawthorne, to inquire which fax number the paperwork was faxed to or if SW should pick up paperwork from PCP office and re-fax. Awaiting return call.

## 2020-11-25 DIAGNOSIS — F0391 Unspecified dementia with behavioral disturbance: Secondary | ICD-10-CM | POA: Diagnosis not present

## 2020-11-25 DIAGNOSIS — J438 Other emphysema: Secondary | ICD-10-CM | POA: Diagnosis not present

## 2020-11-25 DIAGNOSIS — K219 Gastro-esophageal reflux disease without esophagitis: Secondary | ICD-10-CM | POA: Diagnosis not present

## 2020-11-25 DIAGNOSIS — Z9981 Dependence on supplemental oxygen: Secondary | ICD-10-CM | POA: Diagnosis not present

## 2020-11-25 DIAGNOSIS — E785 Hyperlipidemia, unspecified: Secondary | ICD-10-CM | POA: Diagnosis not present

## 2020-11-25 DIAGNOSIS — E039 Hypothyroidism, unspecified: Secondary | ICD-10-CM | POA: Diagnosis not present

## 2020-11-26 DIAGNOSIS — E039 Hypothyroidism, unspecified: Secondary | ICD-10-CM | POA: Diagnosis not present

## 2020-11-26 DIAGNOSIS — E785 Hyperlipidemia, unspecified: Secondary | ICD-10-CM | POA: Diagnosis not present

## 2020-11-26 DIAGNOSIS — J449 Chronic obstructive pulmonary disease, unspecified: Secondary | ICD-10-CM | POA: Diagnosis not present

## 2020-11-26 DIAGNOSIS — Z9981 Dependence on supplemental oxygen: Secondary | ICD-10-CM | POA: Diagnosis not present

## 2020-11-26 DIAGNOSIS — F0391 Unspecified dementia with behavioral disturbance: Secondary | ICD-10-CM | POA: Diagnosis not present

## 2020-11-26 DIAGNOSIS — K219 Gastro-esophageal reflux disease without esophagitis: Secondary | ICD-10-CM | POA: Diagnosis not present

## 2020-11-26 DIAGNOSIS — J438 Other emphysema: Secondary | ICD-10-CM | POA: Diagnosis not present

## 2020-11-27 DIAGNOSIS — E039 Hypothyroidism, unspecified: Secondary | ICD-10-CM | POA: Diagnosis not present

## 2020-11-27 DIAGNOSIS — J438 Other emphysema: Secondary | ICD-10-CM | POA: Diagnosis not present

## 2020-11-27 DIAGNOSIS — K219 Gastro-esophageal reflux disease without esophagitis: Secondary | ICD-10-CM | POA: Diagnosis not present

## 2020-11-27 DIAGNOSIS — F0391 Unspecified dementia with behavioral disturbance: Secondary | ICD-10-CM | POA: Diagnosis not present

## 2020-11-27 DIAGNOSIS — Z9981 Dependence on supplemental oxygen: Secondary | ICD-10-CM | POA: Diagnosis not present

## 2020-11-27 DIAGNOSIS — E785 Hyperlipidemia, unspecified: Secondary | ICD-10-CM | POA: Diagnosis not present

## 2020-11-28 DIAGNOSIS — K219 Gastro-esophageal reflux disease without esophagitis: Secondary | ICD-10-CM | POA: Diagnosis not present

## 2020-11-28 DIAGNOSIS — F0391 Unspecified dementia with behavioral disturbance: Secondary | ICD-10-CM | POA: Diagnosis not present

## 2020-11-28 DIAGNOSIS — J449 Chronic obstructive pulmonary disease, unspecified: Secondary | ICD-10-CM | POA: Diagnosis not present

## 2020-11-28 DIAGNOSIS — E039 Hypothyroidism, unspecified: Secondary | ICD-10-CM | POA: Diagnosis not present

## 2020-11-28 DIAGNOSIS — Z9981 Dependence on supplemental oxygen: Secondary | ICD-10-CM | POA: Diagnosis not present

## 2020-11-28 DIAGNOSIS — E785 Hyperlipidemia, unspecified: Secondary | ICD-10-CM | POA: Diagnosis not present

## 2020-11-28 DIAGNOSIS — J438 Other emphysema: Secondary | ICD-10-CM | POA: Diagnosis not present

## 2020-11-30 DIAGNOSIS — K219 Gastro-esophageal reflux disease without esophagitis: Secondary | ICD-10-CM | POA: Diagnosis not present

## 2020-11-30 DIAGNOSIS — E785 Hyperlipidemia, unspecified: Secondary | ICD-10-CM | POA: Diagnosis not present

## 2020-11-30 DIAGNOSIS — Z9981 Dependence on supplemental oxygen: Secondary | ICD-10-CM | POA: Diagnosis not present

## 2020-11-30 DIAGNOSIS — J438 Other emphysema: Secondary | ICD-10-CM | POA: Diagnosis not present

## 2020-11-30 DIAGNOSIS — J441 Chronic obstructive pulmonary disease with (acute) exacerbation: Secondary | ICD-10-CM | POA: Diagnosis not present

## 2020-11-30 DIAGNOSIS — E039 Hypothyroidism, unspecified: Secondary | ICD-10-CM | POA: Diagnosis not present

## 2020-11-30 DIAGNOSIS — J449 Chronic obstructive pulmonary disease, unspecified: Secondary | ICD-10-CM | POA: Diagnosis not present

## 2020-11-30 DIAGNOSIS — F0391 Unspecified dementia with behavioral disturbance: Secondary | ICD-10-CM | POA: Diagnosis not present

## 2020-12-01 ENCOUNTER — Telehealth: Payer: Self-pay

## 2020-12-01 DIAGNOSIS — F0391 Unspecified dementia with behavioral disturbance: Secondary | ICD-10-CM | POA: Diagnosis not present

## 2020-12-01 NOTE — Telephone Encounter (Signed)
12PM: Palliative care SW received TC from patients son, [name].  Son shared that he still had n ot heard anything about PCS services and that he would be outreaching PCP office again to follow up. Son stated that he and his brothers were concerned about patients current status at [facility], and that patient is being sedated more than treated for his dementia symptoms. Son expressed desire to DC patient to home as soon as possible. Son shared that he feels that he and his brothers along with services from [facility] will be good for patient at home. SW shared with son that Margretta Ditty, SW with [facility], today and was told that patient is nt ready to DC yet and is still having delusional thoughts, refusing medications and aggressive toward staff. Son is expecting a TC from MD at [facility] to discuss patients progress, care plan and meds. Sons desire to is DC patient as soon as possible to home with PCS services.

## 2020-12-01 NOTE — Progress Notes (Signed)
12:45PM: SW spoke with Joy at PCP office and was told that the original PCS form will be refaxed to [facility] and SW.

## 2020-12-01 NOTE — Progress Notes (Signed)
1PM: Palliative care SW faxed completed and signed PCS application to [employer]

## 2020-12-02 DIAGNOSIS — K219 Gastro-esophageal reflux disease without esophagitis: Secondary | ICD-10-CM | POA: Diagnosis not present

## 2020-12-02 DIAGNOSIS — J438 Other emphysema: Secondary | ICD-10-CM | POA: Diagnosis not present

## 2020-12-02 DIAGNOSIS — Z9981 Dependence on supplemental oxygen: Secondary | ICD-10-CM | POA: Diagnosis not present

## 2020-12-02 DIAGNOSIS — F0391 Unspecified dementia with behavioral disturbance: Secondary | ICD-10-CM | POA: Diagnosis not present

## 2020-12-02 DIAGNOSIS — E785 Hyperlipidemia, unspecified: Secondary | ICD-10-CM | POA: Diagnosis not present

## 2020-12-02 DIAGNOSIS — J449 Chronic obstructive pulmonary disease, unspecified: Secondary | ICD-10-CM | POA: Diagnosis not present

## 2020-12-02 DIAGNOSIS — J441 Chronic obstructive pulmonary disease with (acute) exacerbation: Secondary | ICD-10-CM | POA: Diagnosis not present

## 2020-12-02 DIAGNOSIS — E039 Hypothyroidism, unspecified: Secondary | ICD-10-CM | POA: Diagnosis not present

## 2020-12-03 DIAGNOSIS — F0391 Unspecified dementia with behavioral disturbance: Secondary | ICD-10-CM | POA: Diagnosis not present

## 2020-12-04 DIAGNOSIS — Z9981 Dependence on supplemental oxygen: Secondary | ICD-10-CM | POA: Diagnosis not present

## 2020-12-04 DIAGNOSIS — J438 Other emphysema: Secondary | ICD-10-CM | POA: Diagnosis not present

## 2020-12-04 DIAGNOSIS — J449 Chronic obstructive pulmonary disease, unspecified: Secondary | ICD-10-CM | POA: Diagnosis not present

## 2020-12-04 DIAGNOSIS — K219 Gastro-esophageal reflux disease without esophagitis: Secondary | ICD-10-CM | POA: Diagnosis not present

## 2020-12-04 DIAGNOSIS — E039 Hypothyroidism, unspecified: Secondary | ICD-10-CM | POA: Diagnosis not present

## 2020-12-04 DIAGNOSIS — F0391 Unspecified dementia with behavioral disturbance: Secondary | ICD-10-CM | POA: Diagnosis not present

## 2020-12-05 DIAGNOSIS — F0391 Unspecified dementia with behavioral disturbance: Secondary | ICD-10-CM | POA: Diagnosis not present

## 2020-12-06 DIAGNOSIS — Z9981 Dependence on supplemental oxygen: Secondary | ICD-10-CM | POA: Diagnosis not present

## 2020-12-06 DIAGNOSIS — F0391 Unspecified dementia with behavioral disturbance: Secondary | ICD-10-CM | POA: Diagnosis not present

## 2020-12-06 DIAGNOSIS — J449 Chronic obstructive pulmonary disease, unspecified: Secondary | ICD-10-CM | POA: Diagnosis not present

## 2020-12-06 DIAGNOSIS — E039 Hypothyroidism, unspecified: Secondary | ICD-10-CM | POA: Diagnosis not present

## 2020-12-06 DIAGNOSIS — J438 Other emphysema: Secondary | ICD-10-CM | POA: Diagnosis not present

## 2020-12-06 DIAGNOSIS — K219 Gastro-esophageal reflux disease without esophagitis: Secondary | ICD-10-CM | POA: Diagnosis not present

## 2020-12-07 DIAGNOSIS — F0391 Unspecified dementia with behavioral disturbance: Secondary | ICD-10-CM | POA: Diagnosis not present

## 2020-12-07 DIAGNOSIS — J438 Other emphysema: Secondary | ICD-10-CM | POA: Diagnosis not present

## 2020-12-07 DIAGNOSIS — K219 Gastro-esophageal reflux disease without esophagitis: Secondary | ICD-10-CM | POA: Diagnosis not present

## 2020-12-07 DIAGNOSIS — Z9981 Dependence on supplemental oxygen: Secondary | ICD-10-CM | POA: Diagnosis not present

## 2020-12-07 DIAGNOSIS — E039 Hypothyroidism, unspecified: Secondary | ICD-10-CM | POA: Diagnosis not present

## 2020-12-07 DIAGNOSIS — E785 Hyperlipidemia, unspecified: Secondary | ICD-10-CM | POA: Diagnosis not present

## 2020-12-08 DIAGNOSIS — E785 Hyperlipidemia, unspecified: Secondary | ICD-10-CM | POA: Diagnosis not present

## 2020-12-08 DIAGNOSIS — J438 Other emphysema: Secondary | ICD-10-CM | POA: Diagnosis not present

## 2020-12-08 DIAGNOSIS — E039 Hypothyroidism, unspecified: Secondary | ICD-10-CM | POA: Diagnosis not present

## 2020-12-08 DIAGNOSIS — F0391 Unspecified dementia with behavioral disturbance: Secondary | ICD-10-CM | POA: Diagnosis not present

## 2020-12-08 DIAGNOSIS — K219 Gastro-esophageal reflux disease without esophagitis: Secondary | ICD-10-CM | POA: Diagnosis not present

## 2020-12-08 DIAGNOSIS — Z9981 Dependence on supplemental oxygen: Secondary | ICD-10-CM | POA: Diagnosis not present

## 2020-12-08 NOTE — Progress Notes (Unsigned)
1:05PM: Palliative care SW re-faxed PCS referral application to [employer], and received fax confirmation approval.   [place]. To ensure they have received application.

## 2020-12-09 DIAGNOSIS — F0391 Unspecified dementia with behavioral disturbance: Secondary | ICD-10-CM | POA: Diagnosis not present

## 2020-12-10 ENCOUNTER — Telehealth: Payer: Self-pay

## 2020-12-10 DIAGNOSIS — K219 Gastro-esophageal reflux disease without esophagitis: Secondary | ICD-10-CM | POA: Diagnosis not present

## 2020-12-10 DIAGNOSIS — J438 Other emphysema: Secondary | ICD-10-CM | POA: Diagnosis not present

## 2020-12-10 DIAGNOSIS — E039 Hypothyroidism, unspecified: Secondary | ICD-10-CM | POA: Diagnosis not present

## 2020-12-10 DIAGNOSIS — E785 Hyperlipidemia, unspecified: Secondary | ICD-10-CM | POA: Diagnosis not present

## 2020-12-10 DIAGNOSIS — Z9981 Dependence on supplemental oxygen: Secondary | ICD-10-CM | POA: Diagnosis not present

## 2020-12-10 DIAGNOSIS — F0391 Unspecified dementia with behavioral disturbance: Secondary | ICD-10-CM | POA: Diagnosis not present

## 2020-12-10 NOTE — Progress Notes (Signed)
0900: Palliative care SW followed up on PCS application for patient and was informed that patients Medicaid does not cover PCS services.   SW outreached patient son and LVM making him aware. SW outreached [place] DSS and LVM for patient Medicaid Caseworker Arrie Aran Sherren Mocha [phone]) to inquire if patient would qualify for full medicaid benefits.   Patient is being dc'd from [facility] today.

## 2020-12-10 NOTE — Telephone Encounter (Signed)
[id number]: Palliative care SW outreached patients son to schedule in home visit for patient, post DC from [facility].  Visit scheduled for 12/20 @1230  with RN and SW.

## 2020-12-10 NOTE — Progress Notes (Signed)
0940Sharyn Lull, SW with [facility], stated that she has sent a referral to [facility] for continuity of care in the community. SW provided Sharyn Lull with [facility] [phone]) information as well.

## 2020-12-14 ENCOUNTER — Other Ambulatory Visit: Payer: PPO

## 2020-12-14 ENCOUNTER — Other Ambulatory Visit: Payer: Self-pay

## 2020-12-14 VITALS — BP 146/78 | HR 80 | Temp 98.8°F | Wt 139.4 lb

## 2020-12-14 DIAGNOSIS — Z515 Encounter for palliative care: Secondary | ICD-10-CM
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
## 2020-12-15 ENCOUNTER — Other Ambulatory Visit: Payer: PPO

## 2020-12-15 ENCOUNTER — Telehealth: Payer: Self-pay

## 2020-12-15 ENCOUNTER — Other Ambulatory Visit: Payer: Self-pay

## 2020-12-15 DIAGNOSIS — Z515 Encounter for palliative care: Secondary | ICD-10-CM
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
## 2020-12-15 NOTE — Progress Notes (Signed)
COMMUNITY PALLIATIVE CARE SW NOTE  PATIENT NAME: Edward Matthews DOB: 09/13/1941 MRN: [id number]  PRIMARY CARE PROVIDER: [name], MD  RESPONSIBLE PARTY:  Acct ID - Guarantor Home Phone Work Phone Relationship Acct Type  [id number] Edward Matthews,* [id number]  Self P/F     [address], [place], [place] [address]     PLAN OF CARE and INTERVENTIONS:             1. GOALS OF CARE/ ADVANCE CARE PLANNING:  Full code. Goal is to remain in the home.  2. SOCIAL/EMOTIONAL/SPIRITUAL ASSESSMENT/ INTERVENTIONS:  SW made follow up visit to patient home to supply more detailed pill box.          Patients son, Edward Matthews, was presented and greeted SW at door. Patient came into living room without O2 on. SW encouraged patient to place O2 back on, patient stated that he felt fine and does not wear O2 all the time. SW did not check O2 stats, however patient did not appear to be in distress without O2 at this time.  SW followed patient to the kitchen where his medications were.Patient offered SW breakfast and stated that his son had just cooked. Patient shared that he has already taken his AM medications. SW assisted patient in prepping pill box for the rest of the week. Patient was very appreciative of SW visit and supplying the pill box.  Patient shared that his son, Edward Matthews, has bipolar. Son confirmed statement and shared that he has never been manic or delusional and that he is currently on medications. Patients thought process and conversation was much more coherent and clear this visit.   Patient checked his BP during visit (110/64). Patient shared his norm is usually in the 160's. SW left pill box and all other medication bottles on kitchen table. Patient has completed his ABT's for PNA and shared hat he received a phone from his doctor office with an appointment for Monday 12/27 with a new provider, Dr. Theda Sers. No other concerns noted at this time.  Palliative care will continue to follow.   2. PATIENT/CAREGIVER EDUCATION/ COPING:  Patient A&O x3 this visit. With less delusional thought processes. Son, Edward Matthews, present and assist patient in the home. 3. PERSONAL EMERGENCY PLAN:  Patient will call 911 for emergencies. 4. COMMUNITY RESOURCES COORDINATION/ HEALTH CARE NAVIGATION:  Patient and sons manages care. 5. FINANCIAL/LEGAL CONCERNS/INTERVENTIONS:  None.     SOCIAL HX:  Social History   Tobacco Use  . Smoking status: Current Every Day Smoker    Packs/day: 0.50    Years: 63.00    Pack years: 31.50    Types: Cigarettes  . Smokeless tobacco: Never Used  Substance Use Topics  . Alcohol use: Not on file    CODE STATUS: Full code. ADVANCED DIRECTIVES: N MOST FORM COMPLETE:  N HOSPICE EDUCATION PROVIDED: N  PPS: Patient is independent with all ADL's and able to meal prep.    Time spent: 30 min      Edward Matthews, 
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
## 2020-12-17 NOTE — Progress Notes (Signed)
12:20PM: Palliative care [facility] @ [phone], in an attempt to set up outpatient psych services for patient.   SW LVM. Awaiting return call.

## 2020-12-21 DIAGNOSIS — E039 Hypothyroidism, unspecified: Secondary | ICD-10-CM | POA: Diagnosis not present

## 2020-12-21 DIAGNOSIS — F0391 Unspecified dementia with behavioral disturbance: Secondary | ICD-10-CM | POA: Diagnosis not present

## 2020-12-21 DIAGNOSIS — J449 Chronic obstructive pulmonary disease, unspecified: Secondary | ICD-10-CM | POA: Diagnosis not present

## 2020-12-21 DIAGNOSIS — F22 Delusional disorders: Secondary | ICD-10-CM | POA: Diagnosis not present

## 2020-12-21 DIAGNOSIS — Z9981 Dependence on supplemental oxygen: Secondary | ICD-10-CM | POA: Diagnosis not present

## 2020-12-21 DIAGNOSIS — E7800 Pure hypercholesterolemia, unspecified: Secondary | ICD-10-CM | POA: Diagnosis not present

## 2020-12-27 DIAGNOSIS — J449 Chronic obstructive pulmonary disease, unspecified: Secondary | ICD-10-CM | POA: Diagnosis not present

## 2021-01-15 ENCOUNTER — Telehealth: Payer: Self-pay | Admitting: Neurology

## 2021-01-15 NOTE — Telephone Encounter (Signed)
His new patient appt has been cancelled at the patient's request.
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
## 2021-01-20 ENCOUNTER — Ambulatory Visit: Payer: PPO | Admitting: Neurology

## 2021-01-21 ENCOUNTER — Telehealth: Payer: Self-pay

## 2021-01-21 NOTE — Telephone Encounter (Signed)
236 pm.  Phone call made to patient to complete a telephonic visit.  No answer but message has been left requesting a call back.  PLAN:  Awaiting call back.  If no call back received, Palliative Care will outreach patient next month.

## 2021-01-27 DIAGNOSIS — J449 Chronic obstructive pulmonary disease, unspecified: Secondary | ICD-10-CM | POA: Diagnosis not present

## 2021-02-04 ENCOUNTER — Telehealth: Payer: Self-pay

## 2021-02-04 NOTE — Telephone Encounter (Signed)
337 pm.  Telephonic visit completed with patient.  He states he has been doing very well.  Patient is able to confirm current medications.  Aspirin, Synthroid, Advair and Albuterol are being administered daily. He states he is not using the pill box that was brought to him by the Palliative Care SW since he is only taking 2 pills now.   Patient states he is off the other medications that were prescribed to him at [facility].  His son [name] is no longer living with him but is doing telephone check-ins daily.  His other son [name] is doing grocery shopping and picking up medications for patient.  He continues with O2 and reports shortness of breath with exertion.  This has been his baseline.  Patient states he does not have issues with pain other than his feet.  This has been ongoing for years and he stated he saw podiatry but nothing could be done.  He is sleeping for about 6 hours per night.  Patient states he has been doing this since he was a child.  He is managing light housework and cooking meals for himself.  I have offered an in-person visit with myself and Palliative Care SW but patient declines.  He states he is doing very well on his own at this time.  He is agreeable to receive calls from Palliative Care for check-ins.  I have advised him to contact us should needs arise.

## 2021-02-24 DIAGNOSIS — J449 Chronic obstructive pulmonary disease, unspecified: Secondary | ICD-10-CM | POA: Diagnosis not present

## 2021-03-02 ENCOUNTER — Other Ambulatory Visit: Payer: Self-pay

## 2021-03-02 ENCOUNTER — Observation Stay (HOSPITAL_COMMUNITY)
Admission: EM | Admit: 2021-03-02 | Discharge: 2021-03-03 | Disposition: A | Discharge status: Home / Self Care | Payer: PPO | Attending: Internal Medicine | Admitting: Internal Medicine

## 2021-03-02 ENCOUNTER — Encounter (HOSPITAL_COMMUNITY): Payer: Self-pay

## 2021-03-02 ENCOUNTER — Emergency Department (HOSPITAL_COMMUNITY): Payer: PPO

## 2021-03-02 DIAGNOSIS — J441 Chronic obstructive pulmonary disease with (acute) exacerbation: Secondary | ICD-10-CM | POA: Diagnosis not present

## 2021-03-02 DIAGNOSIS — F0390 Unspecified dementia without behavioral disturbance: Secondary | ICD-10-CM | POA: Insufficient documentation

## 2021-03-02 DIAGNOSIS — Z79899 Other long term (current) drug therapy: Secondary | ICD-10-CM | POA: Diagnosis not present

## 2021-03-02 DIAGNOSIS — R0602 Shortness of breath: Secondary | ICD-10-CM | POA: Diagnosis not present

## 2021-03-02 DIAGNOSIS — F17210 Nicotine dependence, cigarettes, uncomplicated: Secondary | ICD-10-CM | POA: Diagnosis not present

## 2021-03-02 DIAGNOSIS — I10 Essential (primary) hypertension: Secondary | ICD-10-CM | POA: Diagnosis not present

## 2021-03-02 DIAGNOSIS — Z20822 Contact with and (suspected) exposure to covid-19: Secondary | ICD-10-CM | POA: Diagnosis not present

## 2021-03-02 DIAGNOSIS — Z7189 Other specified counseling: Secondary | ICD-10-CM | POA: Diagnosis not present

## 2021-03-02 DIAGNOSIS — Z7982 Long term (current) use of aspirin: Secondary | ICD-10-CM | POA: Insufficient documentation

## 2021-03-02 DIAGNOSIS — E039 Hypothyroidism, unspecified: Secondary | ICD-10-CM | POA: Insufficient documentation

## 2021-03-02 DIAGNOSIS — F0391 Unspecified dementia with behavioral disturbance: Secondary | ICD-10-CM

## 2021-03-02 DIAGNOSIS — Z515 Encounter for palliative care: Secondary | ICD-10-CM | POA: Diagnosis not present

## 2021-03-02 DIAGNOSIS — Z720 Tobacco use: Secondary | ICD-10-CM | POA: Diagnosis present

## 2021-03-02 DIAGNOSIS — Z23 Encounter for immunization: Secondary | ICD-10-CM | POA: Insufficient documentation

## 2021-03-02 DIAGNOSIS — R0600 Dyspnea, unspecified: Secondary | ICD-10-CM | POA: Diagnosis not present

## 2021-03-02 DIAGNOSIS — F03918 Unspecified dementia, unspecified severity, with other behavioral disturbance: Secondary | ICD-10-CM

## 2021-03-02 HISTORY — DX: Unspecified dementia with behavioral disturbance: F03.91

## 2021-03-02 HISTORY — DX: Unspecified dementia, unspecified severity, with other behavioral disturbance: F03.918

## 2021-03-02 LAB — I-STAT VENOUS BLOOD GAS, ED
Acid-Base Excess: 4 mmol/L — ABNORMAL HIGH (ref 0.0–2.0)
Bicarbonate: 27.5 mmol/L (ref 20.0–28.0)
Calcium, Ion: 1.12 mmol/L — ABNORMAL LOW (ref 1.15–1.40)
HCT: 38 % — ABNORMAL LOW (ref 39.0–52.0)
Hemoglobin: 12.9 g/dL — ABNORMAL LOW (ref 13.0–17.0)
O2 Saturation: 79 %
Potassium: 4 mmol/L (ref 3.5–5.1)
Sodium: 144 mmol/L (ref 135–145)
TCO2: 29 mmol/L (ref 22–32)
pCO2, Ven: 36.2 mmHg — ABNORMAL LOW (ref 44.0–60.0)
pH, Ven: 7.488 — ABNORMAL HIGH (ref 7.250–7.430)
pO2, Ven: 40 mmHg (ref 32.0–45.0)

## 2021-03-02 LAB — BASIC METABOLIC PANEL
Anion gap: 8 (ref 5–15)
BUN: 24 mg/dL — ABNORMAL HIGH (ref 8–23)
CO2: 26 mmol/L (ref 22–32)
Calcium: 9.2 mg/dL (ref 8.9–10.3)
Chloride: 107 mmol/L (ref 98–111)
Creatinine, Ser: 1.02 mg/dL (ref 0.61–1.24)
GFR, Estimated: >60 mL/min (ref >60)
Glucose, Bld: 103 mg/dL — ABNORMAL HIGH (ref 70–99)
Potassium: 3.6 mmol/L (ref 3.5–5.1)
Sodium: 141 mmol/L (ref 135–145)

## 2021-03-02 LAB — CBC
HCT: 41.7 % (ref 39.0–52.0)
Hemoglobin: 13.7 g/dL (ref 13.0–17.0)
MCH: 31 pg (ref 26.0–34.0)
MCHC: 32.9 g/dL (ref 30.0–36.0)
MCV: 94.3 fL (ref 80.0–100.0)
Platelets: 197 10*3/uL (ref 150–400)
RBC: 4.42 MIL/uL (ref 4.22–5.81)
RDW: 11.8 % (ref 11.5–15.5)
WBC: 7.2 10*3/uL (ref 4.0–10.5)
nRBC: 0 % (ref 0.0–0.2)

## 2021-03-02 LAB — RESP PANEL BY RT-PCR (FLU A&B, COVID) ARPGX2
Influenza A by PCR: NEGATIVE
Influenza B by PCR: NEGATIVE
SARS Coronavirus 2 by RT PCR: NEGATIVE

## 2021-03-02 LAB — TSH: TSH: 0.017 u[IU]/mL — ABNORMAL LOW (ref 0.350–4.500)

## 2021-03-02 MED ORDER — ZOLPIDEM TARTRATE 5 MG PO TABS: 5.0000 mg | ORAL_TABLET | Freq: Every evening | ORAL | Status: DC | PRN

## 2021-03-02 MED ORDER — ASPIRIN 81 MG PO CHEW
81.0000 mg | CHEWABLE_TABLET | Freq: Every day | ORAL | Status: DC
Start: 2021-03-02 — End: 2021-03-03
  Administered 2021-03-02 – 2021-03-03 (×2): 81 mg via ORAL
  Filled 2021-03-02 (×2): qty 1

## 2021-03-02 MED ORDER — POLYETHYLENE GLYCOL 3350 17 G PO PACK: 17.0000 g | PACK | Freq: Every day | ORAL | Status: DC | PRN

## 2021-03-02 MED ORDER — DOCUSATE SODIUM 100 MG PO CAPS
100.0000 mg | ORAL_CAPSULE | Freq: Two times a day (BID) | ORAL | Status: DC
  Administered 2021-03-02 – 2021-03-03 (×3): 100 mg via ORAL
  Filled 2021-03-02 (×3): qty 1

## 2021-03-02 MED ORDER — ACETAMINOPHEN 650 MG RE SUPP: 650.0000 mg | Freq: Four times a day (QID) | RECTAL | Status: DC | PRN

## 2021-03-02 MED ORDER — LEVOTHYROXINE SODIUM 75 MCG PO TABS
150.0000 ug | ORAL_TABLET | Freq: Every day | ORAL | Status: DC
  Administered 2021-03-03: 150 ug via ORAL
  Filled 2021-03-02: qty 2

## 2021-03-02 MED ORDER — COVID-19 MRNA VACC (MODERNA) 50 MCG/0.25ML IM SUSP
0.2500 mL | Freq: Once | INTRAMUSCULAR | Status: AC
  Filled 2021-03-02: qty 0.25

## 2021-03-02 MED ORDER — METHYLPREDNISOLONE SODIUM SUCC 125 MG IJ SOLR
125.0000 mg | Freq: Once | INTRAMUSCULAR | Status: AC
  Administered 2021-03-02: 125 mg via INTRAVENOUS
  Filled 2021-03-02: qty 2

## 2021-03-02 MED ORDER — ENOXAPARIN SODIUM 40 MG/0.4ML ~~LOC~~ SOLN
40.0000 mg | SUBCUTANEOUS | Status: DC
  Administered 2021-03-02: 40 mg via SUBCUTANEOUS
  Filled 2021-03-02 (×2): qty 0.4

## 2021-03-02 MED ORDER — AZITHROMYCIN 250 MG PO TABS
500.0000 mg | ORAL_TABLET | Freq: Every day | ORAL | Status: DC
  Administered 2021-03-03: 500 mg via ORAL
  Filled 2021-03-02 (×2): qty 2

## 2021-03-02 MED ORDER — ALBUTEROL SULFATE (2.5 MG/3ML) 0.083% IN NEBU
2.5000 mg | INHALATION_SOLUTION | RESPIRATORY_TRACT | Status: DC | PRN
Start: 2021-03-02 — End: 2021-03-03

## 2021-03-02 MED ORDER — HYDRALAZINE HCL 20 MG/ML IJ SOLN: 5.0000 mg | INTRAMUSCULAR | Status: DC | PRN

## 2021-03-02 MED ORDER — GUAIFENESIN ER 600 MG PO TB12: 600.0000 mg | ORAL_TABLET | Freq: Two times a day (BID) | ORAL | Status: DC | PRN

## 2021-03-02 MED ORDER — SODIUM CHLORIDE 0.9 % IV SOLN
500.0000 mg | INTRAVENOUS | Status: AC
  Administered 2021-03-02: 500 mg via INTRAVENOUS
  Filled 2021-03-02: qty 500

## 2021-03-02 MED ORDER — ONDANSETRON HCL 4 MG PO TABS: 4.0000 mg | ORAL_TABLET | Freq: Four times a day (QID) | ORAL | Status: DC | PRN

## 2021-03-02 MED ORDER — HYDROCODONE-ACETAMINOPHEN 5-325 MG PO TABS: 1.0000 | ORAL_TABLET | ORAL | Status: DC | PRN

## 2021-03-02 MED ORDER — BISACODYL 5 MG PO TBEC: 5.0000 mg | DELAYED_RELEASE_TABLET | Freq: Every day | ORAL | Status: DC | PRN

## 2021-03-02 MED ORDER — ALBUTEROL SULFATE HFA 108 (90 BASE) MCG/ACT IN AERS: 2.0000 | INHALATION_SPRAY | RESPIRATORY_TRACT | Status: DC | PRN

## 2021-03-02 MED ORDER — IPRATROPIUM BROMIDE HFA 17 MCG/ACT IN AERS
2.0000 | INHALATION_SPRAY | Freq: Once | RESPIRATORY_TRACT | Status: AC
  Administered 2021-03-02: 2 via RESPIRATORY_TRACT
  Filled 2021-03-02: qty 12.9

## 2021-03-02 MED ORDER — FLUTICASONE FUROATE-VILANTEROL 200-25 MCG/INH IN AEPB
1.0000 | INHALATION_SPRAY | Freq: Every day | RESPIRATORY_TRACT | Status: DC
  Administered 2021-03-03: 1 via RESPIRATORY_TRACT
  Filled 2021-03-02: qty 28

## 2021-03-02 MED ORDER — ACETAMINOPHEN 325 MG PO TABS: 650.0000 mg | ORAL_TABLET | Freq: Four times a day (QID) | ORAL | Status: DC | PRN

## 2021-03-02 MED ORDER — LACTATED RINGERS IV SOLN: INTRAVENOUS | Status: AC

## 2021-03-02 MED ORDER — NICOTINE 7 MG/24HR TD PT24
7.0000 mg | MEDICATED_PATCH | Freq: Every day | TRANSDERMAL | Status: DC
  Administered 2021-03-02 – 2021-03-03 (×2): 7 mg via TRANSDERMAL
  Filled 2021-03-02 (×2): qty 1

## 2021-03-02 MED ORDER — ONDANSETRON HCL 4 MG/2ML IJ SOLN: 4.0000 mg | Freq: Four times a day (QID) | INTRAMUSCULAR | Status: DC | PRN

## 2021-03-02 MED ORDER — METHYLPREDNISOLONE SODIUM SUCC 125 MG IJ SOLR
60.0000 mg | Freq: Two times a day (BID) | INTRAMUSCULAR | Status: AC
  Administered 2021-03-02 – 2021-03-03 (×2): 60 mg via INTRAVENOUS
  Filled 2021-03-02 (×2): qty 2

## 2021-03-02 MED ORDER — MORPHINE SULFATE (PF) 2 MG/ML IV SOLN: 2.0000 mg | INTRAVENOUS | Status: DC | PRN

## 2021-03-02 MED ORDER — PREDNISONE 20 MG PO TABS
40.0000 mg | ORAL_TABLET | Freq: Every day | ORAL | Status: DC
  Filled 2021-03-02 (×2): qty 2

## 2021-03-02 MED ORDER — IPRATROPIUM-ALBUTEROL 0.5-2.5 (3) MG/3ML IN SOLN
3.0000 mL | Freq: Four times a day (QID) | RESPIRATORY_TRACT | Status: DC
  Administered 2021-03-02 – 2021-03-03 (×5): 3 mL via RESPIRATORY_TRACT
  Filled 2021-03-02 (×5): qty 3

## 2021-03-02 NOTE — ED Notes (Signed)
Lunch Tray Ordered @ 1015. 

## 2021-03-02 NOTE — ED Provider Notes (Incomplete)
[facility] EMERGENCY DEPARTMENT Provider Note   CSN: [id number] Arrival date & time: 03/02/21  5400     History Chief Complaint  Patient presents with  . Shortness of Breath    Edward Matthews is a 80 y.o. male.  80 y.o male with a PMH of COPD, GERD, Hypothyroid presents to the ED with a chief complaint of shortness of breath x 1 month. Patient has been on home oxygen with a baseline of 3L, states he has "bumped it up" over the past two weeks, he was placed in the ED on 5L, and patient states "I feel like I am not getting enough". Symptoms are exacerbated by ambulation along with movement, making it difficult for him to get around. He had originally quit cigarettes but states he has resume smoking about 2-3 cigarettes daily. He has attempted using his Advair inhaler the last couple of days without much improvement. Cough at baseline. No fever, no chest pain, no BL leg swelling. No prior covid 19 vaccines on record.   The history is provided by the patient and medical records.  Shortness of Breath Severity:  Moderate Onset quality:  Gradual Duration:  2 weeks Progression:  Worsening Chronicity:  Recurrent Context: activity   Relieved by:  Rest and oxygen Worsened by:  Movement and exertion Ineffective treatments:  Inhaler and oxygen Associated symptoms: cough   Associated symptoms: no abdominal pain, no chest pain, no fever, no headaches, no sore throat and no vomiting   Risk factors: tobacco use   Risk factors: no recent alcohol use, no hx of cancer, no hx of PE/DVT and no recent surgery        Past Medical History:  Diagnosis Date  . COPD (chronic obstructive pulmonary disease) ([place])   . GERD (gastroesophageal reflux disease)   . Hypothyroidism     Patient Active Problem List   Diagnosis Date Noted  . Delusional disorder ([place]) 11/18/2020  . Paranoia ([place])   . Tobacco abuse   . Chest pain 01/17/2018  . COPD (chronic obstructive pulmonary  disease) ([place]) 01/17/2018  . Chronic respiratory failure with hypoxia ([place]) 01/17/2018    Past Surgical History:  Procedure Laterality Date  . COLONOSCOPY WITH PROPOFOL N/A 07/28/2017   Procedure: COLONOSCOPY WITH PROPOFOL;  Surgeon: Carol Ada, MD;  Location: [facility];  Service: Endoscopy;  Laterality: N/A;  . ESOPHAGOGASTRODUODENOSCOPY (EGD) WITH PROPOFOL N/A 07/28/2017   Procedure: ESOPHAGOGASTRODUODENOSCOPY (EGD) WITH PROPOFOL;  Surgeon: Carol Ada, MD;  Location: [facility];  Service: Endoscopy;  Laterality: N/A;  . TONSILLECTOMY         Family History  Problem Relation Age of Onset  . Hypertension Father     Social History   Tobacco Use  . Smoking status: Current Every Day Smoker    Packs/day: 0.50    Years: 63.00    Pack years: 31.50    Types: Cigarettes  . Smokeless tobacco: Never Used    Home Medications Prior to Admission medications   Medication Sig Start Date End Date Taking? Authorizing Provider  ADVAIR DISKUS 250-50 MCG/DOSE AEPB Inhale 2 puffs into the lungs 2 (two) times daily.  05/30/17  Yes [provider]  albuterol (VENTOLIN HFA) 108 (90 Base) MCG/ACT inhaler Inhale 1 puff into the lungs in the morning and at bedtime.   Yes [provider]  aspirin 81 MG chewable tablet Chew 81 mg by mouth daily. 08/15/18  Yes [provider]  levothyroxine (SYNTHROID) 150 MCG tablet Take 150 mcg  by mouth daily before breakfast.   Yes [provider]    Allergies    Chantix [varenicline]  Review of Systems   Review of Systems  Constitutional: Negative for fever.  HENT: Negative for sore throat.   Respiratory: Positive for cough and shortness of breath.   Cardiovascular: Negative for chest pain and leg swelling.  Gastrointestinal: Negative for abdominal pain, nausea and vomiting.  Genitourinary: Negative for flank pain.  Musculoskeletal: Negative for back pain.  Skin: Negative for pallor and wound.  Neurological: Negative  for light-headedness and headaches.  All other systems reviewed and are negative.   Physical Exam Updated Vital Signs BP 124/61   Pulse (!) 48   Temp 98 F (36.7 C) (Oral)   Resp 18   SpO2 99%   Physical Exam Vitals and nursing note reviewed.  Constitutional:      Appearance: He is well-developed. He is ill-appearing.  HENT:     Head: Normocephalic and atraumatic.  Eyes:     Pupils: Pupils are equal, round, and reactive to light.  Cardiovascular:     Rate and Rhythm: Normal rate.  Pulmonary:     Effort: Pulmonary effort is normal. Tachypnea present.     Breath sounds: Decreased breath sounds present.  Chest:     Chest wall: No tenderness.  Musculoskeletal:     Right lower leg: No tenderness. No edema.     Left lower leg: No tenderness. No edema.  Skin:    General: Skin is warm and dry.  Neurological:     Mental Status: He is alert and oriented to person, place, and time.     ED Results / Procedures / Treatments   Labs (all labs ordered are listed, but only abnormal results are displayed) Labs Reviewed  BASIC METABOLIC PANEL - Abnormal; Notable for the following components:      Result Value   Glucose, Bld 103 (*)    BUN 24 (*)    All other components within normal limits  I-STAT VENOUS BLOOD GAS, ED - Abnormal; Notable for the following components:   pH, Ven 7.488 (*)    pCO2, Ven 36.2 (*)    Acid-Base Excess 4.0 (*)    Calcium, Ion 1.12 (*)    HCT 38.0 (*)    Hemoglobin 12.9 (*)    All other components within normal limits  CBC    EKG None  Radiology DG Chest Portable 1 View  Result Date: 03/02/2021 CLINICAL DATA:  Dyspnea EXAM: PORTABLE CHEST 1 VIEW COMPARISON:  11/16/2020 FINDINGS: The lungs are symmetrically hyperinflated. Right basilar consolidation has resolved. Subtle reticular infiltrate is noted within the lungs diffusely demonstrating a basilar predominance, left greater than right, new since prior examination possibly representing mild  interstitial pulmonary edema superimposed upon underlying interstitial lung disease or, less likely, atypical infection. There is no pneumothorax or pleural effusion. Cardiac size within normal limits. The central pulmonary arteries are enlarged in keeping with changes of pulmonary arterial hypertension, unchanged. No acute bone abnormality. IMPRESSION: Resolved right basilar consolidation. Subtle diffuse but basilar predominant reticular infiltrate most suggestive of trace pulmonary edema superimposed upon interstitial lung disease. Electronically Signed   By: Fidela Salisbury MD   On: 03/02/2021 06:58    Procedures Procedures {Remember to document critical care time when appropriate:1}  Medications Ordered in ED Medications  ipratropium (ATROVENT HFA) inhaler 2 puff (2 puffs Inhalation Given 03/02/21 0751)  methylPREDNISolone sodium succinate (SOLU-MEDROL) 125 mg/2 mL injection 125 mg (125 mg Intravenous Given  03/02/21 0751)    ED Course  I have reviewed the triage vital signs and the nursing notes.  Pertinent labs & imaging results that were available during my care of the patient were reviewed by me and considered in my medical decision making (see chart for details).    MDM Rules/Calculators/A&P   Patient with COPD on baseline oxygen of 3L, has had increased to 5L in the past two weeks. Attempted inhaler Advair without much improvement. Baseline cough, resumed smoking 2-3 cigarettes daily. Symptoms worsen with exertion. No fever, URI symptoms, no prior covid 19 immunizations on file.   Extensive chart review reveals a visit from November 19, 2020, patient did have listed a prior history of dementia and was seen at [facility] as well.  He was IVC exam.  However, vitals noted to be standing and 95% on 3 L.  On today's visit he was below the 95% range on 3 L therefore was bumped up to 5 L.  In addition, patient did have COVID-19 in the month of October 2021, according to records he did receive a  monoclonal infusion for this.  Interpretation of his labs showed, CBC without any leukocytosis, hemoglobin is within normal limit.  BMP without any electrolyte abnormality, creatine is within normal limits. VBG with a normal bicarb but slight change in his CO2.   Xray of his chest showed: Resolved right basilar consolidation.    Subtle diffuse but basilar predominant reticular infiltrate most  suggestive of trace pulmonary edema superimposed upon interstitial  lung disease.   Given ipatropium inhaler along with steroids to help with his breathing.  Has been maintaining sats around 99%.  I discussed results of his labs along with chest x-ray with him and his son at the bedside.  Does report he has been under a lot more stress, has not had any chest pain.  I questioned him about his appointment with neurology, he reports that his other son [name] has tried to manage his care.  There is no tachycardia, hypoxia noted, we discussed plus or minus CT chest to rule out pulmonary embolism, although I feel like this is more acute on chronic COPD worsening.  In addition, patient was admitted in November 2021, started on Aricept, Zyprexa, I do not see any of these medications listed.  We will order pharmacy technician to review meds as I do not believe patient is taking these current medications at this time.  Extensive chart review notes that patient does have a prior history of mild dementia, delusional disorder, will likely need this further looked into while in the hospital. Will place call for admission.    Portions of this note were generated with Lobbyist. Dictation errors may occur despite best attempts at proofreading.  Final Clinical Impression(s) / ED Diagnoses Final diagnoses:  Shortness of breath    Rx / DC Orders ED Discharge Orders    None
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
## 2021-03-02 NOTE — Progress Notes (Signed)
[facility] [address] [facility]) Hospital Liaison RN note  This patient is currently enrolled in [facility] outpatient-based palliative care.   Will continue to follow for disposition.  Please call for any outpatient palliative care questions or concerns.  Thank you, Margaretmary Eddy, BSN, RN [facility] Liaison [phone]

## 2021-03-02 NOTE — ED Triage Notes (Signed)
Pt states that he has been having SOB for a while, hx of COPD, wears 3L all the time. Speaks in short sentences, accessory muscle use

## 2021-03-02 NOTE — H&P (Addendum)
History and Physical    Edward Matthews:[id number] DOB: January 15, 1941 DOA: 03/02/2021  [name], MD Consultants:  Krista Blue - neurology; Palliative care Patient coming from:  Home - lives with son; NOK: Edward Matthews, but not [place]; [phone]  Chief Complaint: SOB  HPI: Edward Matthews is a 80 y.o. male with medical history significant of hypothyroidism; GERD; HLD; dementia; and chronic respiratory failure on 3L home O2 associated with COPD presenting with SOB.  He reports that he hasn't been able to breathe.  He was given steroids and he feels much better.  He wears 3L O2 24/7 at home but recently increased to 4L in the last year.  Increased cough the last 2 days, clear but thick sputum.  No fevers.  He uses Advair BID and Albuterol 3-5 times a day.  He is currently smoking a few cigarettes/day.  >60 pack years.  He appears to be appropriate for outpatient management and so was offered the option of discharge vs. Overnight observation.  He began talking about not having effective heating or AC at home - and I clearly explained that we will not be able to fix that issue for him and so it will need to be addressed since he will likely be discharged tomorrow.  He prefers overnight observation.  He was brought in under IVC paperwork in 10/2020.  He was admitted to [facility] from 11/25-12/16.  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  The patient and his son Edward Matthews) report that he does not have dementia but gets confused when he is having problems with SOB.  They deny further behavioral issues and report that this all stemmed from issues with his other son, Edward Matthews - who is no longer allowed to be involved in the patient's care.    ED Course:  H/o COPD, on 3L but increased to 5L at home and 99%.  No increased use of inhaler.  Recently resumed smoking.  CXR with ?infiltrate.  Likely  worsening COPD.  Also with h/o dementia - canceled appt with neurology.    Review of Systems: As per HPI; otherwise review of systems reviewed and negative.   Ambulatory Status:  Ambulates without assistance  COVID Vaccine Status:   Complete  Past Medical History:  Diagnosis Date  . COPD (chronic obstructive pulmonary disease) ([place])   . Dementia with behavioral disturbance ([place]) 03/02/2021  . GERD (gastroesophageal reflux disease)   . Hypothyroidism     Past Surgical History:  Procedure Laterality Date  . COLONOSCOPY WITH PROPOFOL N/A 07/28/2017   Procedure: COLONOSCOPY WITH PROPOFOL;  Surgeon: Carol Ada, MD;  Location: [facility];  Service: Endoscopy;  Laterality: N/A;  . ESOPHAGOGASTRODUODENOSCOPY (EGD) WITH PROPOFOL N/A 07/28/2017   Procedure: ESOPHAGOGASTRODUODENOSCOPY (EGD) WITH PROPOFOL;  Surgeon: Carol Ada, MD;  Location: [facility];  Service: Endoscopy;  Laterality: N/A;  . TONSILLECTOMY      Social History   Socioeconomic History  . Marital status: Widowed    Spouse name: Not on file  . Number of children: Not on file  . Years of education: Not on file  . Highest education level: Not on file  Occupational History  . Occupation: retired  Tobacco Use  . Smoking status: Current Every Day Smoker    Packs/day: 1.50    Years: 63.00    Pack years: 94.50    Types: Cigarettes  . Smokeless tobacco: Never Used  Substance  and Sexual Activity  . Alcohol use: Yes    Comment: occasional  . Drug use: Not Currently  . Sexual activity: Not on file  Other Topics Concern  . Not on file  Social History Narrative  . Not on file   Social Determinants of Health   Financial Resource Strain: Not on file  Food Insecurity: Not on file  Transportation Needs: Not on file  Physical Activity: Not on file  Stress: Not on file  Social Connections: Not on file  Intimate Partner Violence: Not on file    Allergies  Allergen Reactions  . Chantix [Varenicline] Hives     Family History  Problem Relation Age of Onset  . Hypertension Father     Prior to Admission medications   Medication Sig Start Date End Date Taking? Authorizing Provider  ADVAIR DISKUS 250-50 MCG/DOSE AEPB Inhale 2 puffs into the lungs 2 (two) times daily.  05/30/17  Yes [provider]  albuterol (VENTOLIN HFA) 108 (90 Base) MCG/ACT inhaler Inhale 1 puff into the lungs in the morning and at bedtime.   Yes [provider]  aspirin 81 MG chewable tablet Chew 81 mg by mouth daily. 08/15/18  Yes [provider]  levothyroxine (SYNTHROID) 150 MCG tablet Take 150 mcg by mouth daily before breakfast.   Yes [provider]    Physical Exam: Vitals:   03/02/21 0845 03/02/21 0900 03/02/21 0930 03/02/21 0945  BP: 131/81 121/73 137/77 (!) 145/85  Pulse: 61 (!) 53 60 (!) 57  Resp: (!) 21 (!) 22 13 18   Temp:      TempSrc:      SpO2: 97% 98% 96% 94%     . General:  Appears calm and comfortable and is in NAD . Eyes:  PERRL, EOMI, normal lids, iris . ENT:  grossly normal hearing, lips & tongue, mmm; edentulous . Neck:  no LAD, masses or thyromegaly . Cardiovascular:  RR with mild bradycardia, no m/r/g. No LE edema.  Marland Kitchen Respiratory:   CTA bilaterally with no wheezes/rales/rhonchi.  Normal respiratory effort. . Abdomen:  soft, NT, ND, NABS . Skin:  no rash or induration seen on limited exam; callous on L plantar surface . Musculoskeletal:  grossly normal tone BUE/BLE, good ROM, no bony abnormality . Psychiatric:  grossly normal mood and affect, speech fluent and appropriate, AOx3 . Neurologic:  CN 2-12 grossly intact, moves all extremities in coordinated fashion    Radiological Exams on Admission: Independently reviewed - see discussion in A/P where applicable  DG Chest Portable 1 View  Result Date: 03/02/2021 CLINICAL DATA:  Dyspnea EXAM: PORTABLE CHEST 1 VIEW COMPARISON:  11/16/2020 FINDINGS: The lungs are symmetrically hyperinflated. Right basilar  consolidation has resolved. Subtle reticular infiltrate is noted within the lungs diffusely demonstrating a basilar predominance, left greater than right, new since prior examination possibly representing mild interstitial pulmonary edema superimposed upon underlying interstitial lung disease or, less likely, atypical infection. There is no pneumothorax or pleural effusion. Cardiac size within normal limits. The central pulmonary arteries are enlarged in keeping with changes of pulmonary arterial hypertension, unchanged. No acute bone abnormality. IMPRESSION: Resolved right basilar consolidation. Subtle diffuse but basilar predominant reticular infiltrate most suggestive of trace pulmonary edema superimposed upon interstitial lung disease. Electronically Signed   By: Fidela Salisbury MD   On: 03/02/2021 06:58    EKG: Independently reviewed.  Sinus bradycardia with rate 59; nonspecific ST changes with no evidence of acute ischemia   Labs on Admission: I have personally  reviewed the available labs and imaging studies at the time of the admission.  Pertinent labs:   VBG: 7.488/36.2/40/27.5 BUN 24/Creatinine 1.02/GFR >60 Normal CBC COVID/flu negative   Assessment/Plan Principal Problem:   COPD with acute exacerbation (HCC) Active Problems:   Tobacco abuse   Dementia with behavioral disturbance (HCC)   Hypothyroidism (acquired)   Acute on chronic respiratory failure associated with a COPD exacerbation -Patient's shortness of breath and productive cough are most likely caused by acute COPD exacerbation vs worsening baseline COPD. -Regardless, he appears generally well and this could likely be handled as an outpatient - but there are housing issues and also apparent issues with medication unawareness and so he may benefit from overnight observation  -He has history of O2-dependent COPD and has increased his home O2 from 3 to 4L in the last year -He uses Albuterol more as a scheduled medication; is  not on Atrovent; and is continuing to smoke -He does not have fever or leukocytosis and VBG is reassuring -Chest x-ray is not consistent with pneumonia -He was given a Atrovent and Solumedrol in the ED with some improvement.   -will observe for now  -Nebulizers: scheduled Duoneb and prn albuterol -Solu-Medrol 80 mg IV BID -> Prednisone 40 mg PO daily; 5 days of treatment is thought to be sufficient for treatment of acute COPD exacerbations -IV -> PO Azithromycin (has 2/3 cardinal symptoms)  -Continue Advair (formulary substitution to Breo - and once daily dosing may be better) -Coordinated care with [facility] team/PT/OT/Nutrition/RT consults -Anticipate d/c to home tomorrow -Palliative care has been peripherally involved, according to patient/family; will request palliative care consult  Hypothyroidism -Check TSH -Continue Synthroid at current dose for now  Possible dementia -Patient was involuntarily committed in November-December -He was diagnosed with dementia at [facility] and started on medications for psychosis as well -He and his son currently report that he does not have dementia and this was all related to a family squabble -He is not taking medications for this issue -For now, no intervention  Tobacco dependence -Encourage cessation.   -This was discussed with the patient and should be reviewed on an ongoing basis.   -Patch ordered     Note: This patient has been tested and is pending for the novel coronavirus COVID-19. He has been fully vaccinated against COVID-19 but has not received his booster; he would like to receive the booster while hospitalized and this has been ordered.    DVT prophylaxis: Lovenox  Code Status:  Full- confirmed with patient/family Family Communication: Son was present throughout evaluation Disposition Plan:  The patient is from: home  Anticipated d/c is to: home, possibly with [facility] services  Anticipated d/c date will depend on clinical response to  treatment, likely tomorrow  Patient is currently: acutely ill Consults called: TOC team/PT/OT/Nutrition/RT  Admission status: It is my clinical opinion that referral for OBSERVATION is reasonable and necessary in this patient based on the above information provided. The aforementioned taken together are felt to place the patient at high risk for further clinical deterioration. However it is anticipated that the patient may be medically stable for discharge from the hospital within 24 to 48 hours.    Karmen Bongo MD [facility]   How to contact the [facility] Attending or Consulting provider [place] or covering provider during after hours [place], for this patient?  1. Check the care team in [facility] and look for a) attending/consulting TR[facility] provider listed and b) the [facility] team listed 2.  Log into [email or web address] and use [place]'s universal password to access. If you do not have the password, please contact the hospital operator. 3. Locate the [facility] provider you are looking for under [facility] and page to a number that you can be directly reached. 4. If you still have difficulty reaching the provider, please page the [facility] (Director on Call) for the Hospitalists listed on amion for assistance.   03/02/2021, 11:26 AM

## 2021-03-03 DIAGNOSIS — E039 Hypothyroidism, unspecified: Secondary | ICD-10-CM | POA: Diagnosis not present

## 2021-03-03 DIAGNOSIS — J9621 Acute and chronic respiratory failure with hypoxia: Secondary | ICD-10-CM

## 2021-03-03 DIAGNOSIS — F0391 Unspecified dementia with behavioral disturbance: Secondary | ICD-10-CM

## 2021-03-03 DIAGNOSIS — J441 Chronic obstructive pulmonary disease with (acute) exacerbation: Secondary | ICD-10-CM | POA: Diagnosis not present

## 2021-03-03 DIAGNOSIS — Z720 Tobacco use: Secondary | ICD-10-CM

## 2021-03-03 LAB — BASIC METABOLIC PANEL
Anion gap: 7 (ref 5–15)
BUN: 28 mg/dL — ABNORMAL HIGH (ref 8–23)
CO2: 26 mmol/L (ref 22–32)
Calcium: 8.7 mg/dL — ABNORMAL LOW (ref 8.9–10.3)
Chloride: 106 mmol/L (ref 98–111)
Creatinine, Ser: 0.98 mg/dL (ref 0.61–1.24)
GFR, Estimated: >60 mL/min (ref >60)
Glucose, Bld: 102 mg/dL — ABNORMAL HIGH (ref 70–99)
Potassium: 3.4 mmol/L — ABNORMAL LOW (ref 3.5–5.1)
Sodium: 139 mmol/L (ref 135–145)

## 2021-03-03 LAB — CBC
HCT: 34 % — ABNORMAL LOW (ref 39.0–52.0)
Hemoglobin: 11.8 g/dL — ABNORMAL LOW (ref 13.0–17.0)
MCH: 32.1 pg (ref 26.0–34.0)
MCHC: 34.7 g/dL (ref 30.0–36.0)
MCV: 92.4 fL (ref 80.0–100.0)
Platelets: 173 10*3/uL (ref 150–400)
RBC: 3.68 MIL/uL — ABNORMAL LOW (ref 4.22–5.81)
RDW: 11.9 % (ref 11.5–15.5)
WBC: 12.7 10*3/uL — ABNORMAL HIGH (ref 4.0–10.5)
nRBC: 0 % (ref 0.0–0.2)

## 2021-03-03 MED ORDER — GUAIFENESIN ER 600 MG PO TB12: 600.0000 mg | ORAL_TABLET | Freq: Two times a day (BID) | ORAL | 0 refills | Status: AC | PRN

## 2021-03-03 MED ORDER — IPRATROPIUM-ALBUTEROL 0.5-2.5 (3) MG/3ML IN SOLN: 3.0000 mL | RESPIRATORY_TRACT | Status: DC | PRN

## 2021-03-03 MED ORDER — PREDNISONE 20 MG PO TABS: 40.0000 mg | ORAL_TABLET | Freq: Every day | ORAL | 0 refills | Status: DC

## 2021-03-03 MED ORDER — COVID-19 MRNA VACC (MODERNA) 50 MCG/0.25ML IM SUSP
0.2500 mL | Freq: Once | INTRAMUSCULAR | Status: AC
  Administered 2021-03-03: 0.25 mL via INTRAMUSCULAR
  Filled 2021-03-03: qty 0.25

## 2021-03-03 NOTE — Evaluation (Signed)
Physical Therapy Evaluation and D/C Patient Details Name: Edward Matthews MRN: [id number] DOB: Oct 30, 1941 Today's Date: 03/03/2021   History of Present Illness  80 y.o. male presented with worsening shortness of breath and on presentation he required 5 L oxygen.  Chest x-ray was negative for infiltrates.  COVID-19 test was negative.  He was treated with IV Solu-Medrol.  PMH:   hypothyroidism; GERD; HLD; probable dementia; and chronic respiratory failure on 3L home O2 associated with COPD  Clinical Impression  Pt admitted with above diagnosis. Pt was able to ambulate with Independence and no LOB.  Does need 4LO2 with ambulation to maintain sats and 3L at rest.  Pt and MD aware.  No current skilled PT needs.  Will sign off.     Follow Up Recommendations No PT follow up;Supervision - Intermittent    Equipment Recommendations  Other (comment) (Home O2)    Recommendations for Other Services       Precautions / Restrictions Precautions Precautions: Fall Restrictions Weight Bearing Restrictions: No      Mobility  Bed Mobility Overal bed mobility: Independent                  Transfers Overall transfer level: Independent                  Ambulation/Gait Ambulation/Gait assistance: Independent Gait Distance (Feet): 100 Feet (50 feet x 2) Assistive device: None Gait Pattern/deviations: WFL(Within Functional Limits)   Gait velocity interpretation: <1.31 ft/sec, indicative of household ambulator General Gait Details: Pt ambulated 2 laps x 2 in the room.  No LOB.  First attempt with 3L in place O2 desat to 84%. Second ateempt with 4L in place, pt O2 maintained above 90%.  Stairs            Wheelchair Mobility    Modified Rankin (Stroke Patients Only)       Balance Overall balance assessment: Needs assistance         Standing balance support: No upper extremity supported;During functional activity Standing balance-Leahy Scale: Good Standing balance  comment: No LOB                             Pertinent Vitals/Pain Pain Assessment: No/denies pain    Home Living Family/patient expects to be discharged to:: Private residence Living Arrangements: Alone Available Help at Discharge: Available PRN/intermittently Type of Home: House       Home Layout: One level Home Equipment: None      Prior Function Level of Independence: Independent               Hand Dominance        Extremity/Trunk Assessment   Upper Extremity Assessment Upper Extremity Assessment: Defer to OT evaluation    Lower Extremity Assessment Lower Extremity Assessment: Overall WFL for tasks assessed    Cervical / Trunk Assessment Cervical / Trunk Assessment: Normal  Communication   Communication: No difficulties  Cognition Arousal/Alertness: Awake/alert Behavior During Therapy: WFL for tasks assessed/performed Overall Cognitive Status: Within Functional Limits for tasks assessed                                        General Comments General comments (skin integrity, edema, etc.): HR 66-90bpm, 95% on 3L at rest.  84% on 3L with activity.  91% on 4L with activity.  Exercises General Exercises - Lower Extremity Long Arc Quad: AROM;Both;10 reps;Seated   Assessment/Plan    PT Assessment Patent does not need any further PT services  PT Problem List Decreased activity tolerance;Decreased balance;Decreased mobility;Decreased knowledge of use of DME;Decreased safety awareness;Decreased knowledge of precautions;Cardiopulmonary status limiting activity       PT Treatment Interventions Functional mobility training;Therapeutic activities;Therapeutic exercise;Gait training;Balance training;Patient/family education    PT Goals (Current goals can be found in the Care Plan section)  Acute Rehab PT Goals Patient Stated Goal: to go home PT Goal Formulation: All assessment and education complete, DC therapy    Frequency      Barriers to discharge        Co-evaluation               AM-PAC PT "6 Clicks" Mobility  Outcome Measure Help needed turning from your back to your side while in a flat bed without using bedrails?: None Help needed moving from lying on your back to sitting on the side of a flat bed without using bedrails?: None Help needed moving to and from a bed to a chair (including a wheelchair)?: None Help needed standing up from a chair using your arms (e.g., wheelchair or bedside chair)?: None Help needed to walk in hospital room?: None Help needed climbing 3-5 steps with a railing? : None 6 Click Score: 24    End of Session Equipment Utilized During Treatment: Gait belt;Oxygen Activity Tolerance: Patient tolerated treatment well Patient left: in bed;with call bell/phone within reach Nurse Communication: Mobility status PT Visit Diagnosis: Muscle weakness (generalized) (M62.81)    Time: 3361-2244 PT Time Calculation (min) (ACUTE ONLY): 14 min   Charges:   PT Evaluation $PT Eval Low Complexity: 1 Low          Keymoni Mccaster M,PT Acute Rehab Services [phone] (pager)  Alvira Philips 03/03/2021, 12:35 PM
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
## 2021-03-03 NOTE — Progress Notes (Signed)
   03/03/21 1000  PT Visit Information  Last PT Received On 03/03/21  Assistance Needed +1  PT Recommendation  Follow Up Recommendations No PT follow up;Supervision - Intermittent  PT equipment Other (comment) (Home O2)  Pt continues to need home O2 to maintain sats >90%.  Full note to follow for PT evaluation. Pt has no mobility f/u or equipment needs and is most likely close to baseline.  Dacotah Cabello M,PT Acute Rehab Services [phone] (pager)

## 2021-03-03 NOTE — Progress Notes (Signed)
OT Cancellation Note  Patient Details Name: Edward Matthews MRN: [id number] DOB: 04-17-1941   Cancelled Treatment:    Reason Eval/Treat Not Completed: Patient declined, no reason specified. Initiated OT eval with pt declining formal OT eval, reporting he doesn't need that. Declined OT checking O2 sats, that he knows when his O2 is low and adjusts it himself. Educated on MD orders for OT assessment to ensure safe DC home today but pt did not seem to understand purpose despite multiple attempts. Pt says "the last time a little girl like you from therapy came in, I got nervous and was messed up for a week with all the questions". OT to sign off per pt request. Please reconsult if needs change.   Pt reports he son stays with him sometimes if he needs him. Would recommend son stay with pt initially after DC to ensure safety.   Layla Maw 03/03/2021, 7:34 AM

## 2021-03-03 NOTE — Progress Notes (Signed)
SATURATION QUALIFICATIONS: (This note is used to comply with regulatory documentation for home oxygen)  Patient Saturations on Room Air at Rest = 86%  Patient Saturations on Room Air while Ambulating = NT as desats on RA at rest  Patient Saturations on 4 Liters of oxygen while Ambulating = 90%  Please briefly explain why patient needs home oxygen:Pt desats on RA at rest.  Pt >90% on 3L at rest.  Pt sats on 3L with activity are 86% but improve to >90% with 4L with activity. Pt aware. Thanks.  Nghia Mcentee M,PT Acute Rehab Services [phone] (pager)

## 2021-03-04 ENCOUNTER — Telehealth: Payer: Self-pay

## 2021-03-04 NOTE — Telephone Encounter (Signed)
2:40PM: Palliative care SW outreached patient to f/u from ED visit.   Patient shared that he is not feeling to good today and is tried from being at the hospital so long the day before. Patient shared that his breathing is okay today and he has started the steroid medication prescribed to him. He states his son, Edward Matthews, in the home with him currently.   Patient declined a visit for tomorrow, but was open to a call on Monday to schedule a visit. SW will outreach patient again Monday 3/14.

## 2021-03-09 ENCOUNTER — Telehealth: Payer: Self-pay

## 2021-03-09 NOTE — Telephone Encounter (Signed)
03/09/21 @12PM : Palliative care SW outreached patient to do a telephonic check in  and schedule in home visit.   Patient shared that he has not ben feeling today and is havin difficulty with his breathing. Patient was very [facility] while on the phone, SW inquired if patient had his oxygen at this time and she shared that he did. Patient shared that his O2 is currently on 4.5L. Patient stated that he was not up for a conversation or visit at this time. Patient was a btt suspicious of SW intent to visit at first stating that "I know you probably talked [name], I dont want him in my business". [name] is patients son, SW advised patient that there has been no communication between SW and son and that SW just wanted to check on patients health and help prevent another ER visit. Patient stated that he appreciate the phone call but declined a visit. Stated that his other son [name] is coming to check on him periodically. Patient was receptive of another phone call from SW in a few days.  Palliative care will continue to monitor and offer support.

## 2021-03-09 NOTE — Telephone Encounter (Signed)
See note

## 2021-03-11 ENCOUNTER — Observation Stay (HOSPITAL_COMMUNITY)
Admission: EM | Admit: 2021-03-11 | Discharge: 2021-03-13 | Disposition: A | Discharge status: Home / Self Care | Payer: PPO | Attending: Internal Medicine | Admitting: Internal Medicine

## 2021-03-11 ENCOUNTER — Emergency Department (HOSPITAL_COMMUNITY): Payer: PPO

## 2021-03-11 ENCOUNTER — Encounter (HOSPITAL_COMMUNITY): Payer: Self-pay

## 2021-03-11 DIAGNOSIS — F17210 Nicotine dependence, cigarettes, uncomplicated: Secondary | ICD-10-CM | POA: Diagnosis not present

## 2021-03-11 DIAGNOSIS — J441 Chronic obstructive pulmonary disease with (acute) exacerbation: Secondary | ICD-10-CM | POA: Diagnosis present

## 2021-03-11 DIAGNOSIS — I672 Cerebral atherosclerosis: Secondary | ICD-10-CM | POA: Diagnosis not present

## 2021-03-11 DIAGNOSIS — J439 Emphysema, unspecified: Secondary | ICD-10-CM | POA: Diagnosis not present

## 2021-03-11 DIAGNOSIS — Z20822 Contact with and (suspected) exposure to covid-19: Secondary | ICD-10-CM | POA: Diagnosis not present

## 2021-03-11 DIAGNOSIS — K122 Cellulitis and abscess of mouth: Principal | ICD-10-CM | POA: Diagnosis present

## 2021-03-11 DIAGNOSIS — R0600 Dyspnea, unspecified: Secondary | ICD-10-CM | POA: Diagnosis present

## 2021-03-11 DIAGNOSIS — D72829 Elevated white blood cell count, unspecified: Secondary | ICD-10-CM | POA: Diagnosis present

## 2021-03-11 DIAGNOSIS — R0602 Shortness of breath: Secondary | ICD-10-CM | POA: Diagnosis not present

## 2021-03-11 DIAGNOSIS — E039 Hypothyroidism, unspecified: Secondary | ICD-10-CM | POA: Diagnosis not present

## 2021-03-11 DIAGNOSIS — Z79899 Other long term (current) drug therapy: Secondary | ICD-10-CM | POA: Insufficient documentation

## 2021-03-11 DIAGNOSIS — F0391 Unspecified dementia with behavioral disturbance: Secondary | ICD-10-CM | POA: Diagnosis not present

## 2021-03-11 DIAGNOSIS — R39198 Other difficulties with micturition: Secondary | ICD-10-CM | POA: Diagnosis not present

## 2021-03-11 DIAGNOSIS — J9611 Chronic respiratory failure with hypoxia: Secondary | ICD-10-CM | POA: Diagnosis present

## 2021-03-11 DIAGNOSIS — T783XXA Angioneurotic edema, initial encounter: Secondary | ICD-10-CM | POA: Diagnosis present

## 2021-03-11 DIAGNOSIS — Z7982 Long term (current) use of aspirin: Secondary | ICD-10-CM | POA: Insufficient documentation

## 2021-03-11 DIAGNOSIS — R42 Dizziness and giddiness: Secondary | ICD-10-CM | POA: Diagnosis not present

## 2021-03-11 DIAGNOSIS — F03918 Unspecified dementia, unspecified severity, with other behavioral disturbance: Secondary | ICD-10-CM | POA: Diagnosis present

## 2021-03-11 DIAGNOSIS — J392 Other diseases of pharynx: Secondary | ICD-10-CM | POA: Diagnosis not present

## 2021-03-11 DIAGNOSIS — N289 Disorder of kidney and ureter, unspecified: Secondary | ICD-10-CM

## 2021-03-11 DIAGNOSIS — J029 Acute pharyngitis, unspecified: Secondary | ICD-10-CM | POA: Diagnosis not present

## 2021-03-11 DIAGNOSIS — R0902 Hypoxemia: Secondary | ICD-10-CM | POA: Diagnosis not present

## 2021-03-11 MED ORDER — IPRATROPIUM BROMIDE HFA 17 MCG/ACT IN AERS
2.0000 | INHALATION_SPRAY | Freq: Once | RESPIRATORY_TRACT | Status: AC
  Administered 2021-03-12: 2 via RESPIRATORY_TRACT
  Filled 2021-03-11: qty 12.9

## 2021-03-11 MED ORDER — ALBUTEROL SULFATE HFA 108 (90 BASE) MCG/ACT IN AERS
6.0000 | INHALATION_SPRAY | Freq: Once | RESPIRATORY_TRACT | Status: AC
  Administered 2021-03-12: 6 via RESPIRATORY_TRACT
  Filled 2021-03-11: qty 6.7

## 2021-03-11 MED ORDER — BENZONATATE 100 MG PO CAPS
100.0000 mg | ORAL_CAPSULE | Freq: Once | ORAL | Status: AC
  Administered 2021-03-12: 100 mg via ORAL
  Filled 2021-03-11: qty 1

## 2021-03-11 NOTE — ED Triage Notes (Signed)
Assume care from EMS, reports pt is coming from home with CC of SOB and Difficulty urinating. Pt has an hx of COPD & dementia and wears a baseline of 4L with O2 sats 97%. Pt states " he cannot breath"   136/82 HR 72 100 [place] 4L CBG 111

## 2021-03-12 ENCOUNTER — Other Ambulatory Visit: Payer: Self-pay

## 2021-03-12 ENCOUNTER — Emergency Department (HOSPITAL_COMMUNITY): Payer: PPO

## 2021-03-12 DIAGNOSIS — J441 Chronic obstructive pulmonary disease with (acute) exacerbation: Secondary | ICD-10-CM

## 2021-03-12 DIAGNOSIS — F0391 Unspecified dementia with behavioral disturbance: Secondary | ICD-10-CM

## 2021-03-12 DIAGNOSIS — T783XXA Angioneurotic edema, initial encounter: Secondary | ICD-10-CM | POA: Diagnosis not present

## 2021-03-12 DIAGNOSIS — D72829 Elevated white blood cell count, unspecified: Secondary | ICD-10-CM | POA: Diagnosis present

## 2021-03-12 DIAGNOSIS — K122 Cellulitis and abscess of mouth: Secondary | ICD-10-CM | POA: Diagnosis not present

## 2021-03-12 DIAGNOSIS — I672 Cerebral atherosclerosis: Secondary | ICD-10-CM | POA: Diagnosis not present

## 2021-03-12 DIAGNOSIS — J029 Acute pharyngitis, unspecified: Secondary | ICD-10-CM | POA: Diagnosis not present

## 2021-03-12 DIAGNOSIS — R39198 Other difficulties with micturition: Secondary | ICD-10-CM | POA: Diagnosis not present

## 2021-03-12 DIAGNOSIS — N289 Disorder of kidney and ureter, unspecified: Secondary | ICD-10-CM | POA: Diagnosis not present

## 2021-03-12 DIAGNOSIS — J392 Other diseases of pharynx: Secondary | ICD-10-CM | POA: Diagnosis not present

## 2021-03-12 DIAGNOSIS — R0602 Shortness of breath: Secondary | ICD-10-CM | POA: Diagnosis not present

## 2021-03-12 DIAGNOSIS — J9611 Chronic respiratory failure with hypoxia: Secondary | ICD-10-CM | POA: Diagnosis not present

## 2021-03-12 DIAGNOSIS — E039 Hypothyroidism, unspecified: Secondary | ICD-10-CM

## 2021-03-12 LAB — RAPID URINE DRUG SCREEN, HOSP PERFORMED
Amphetamines: NOT DETECTED
Barbiturates: NOT DETECTED
Benzodiazepines: NOT DETECTED
Cocaine: NOT DETECTED
Opiates: NOT DETECTED
Tetrahydrocannabinol: POSITIVE — AB

## 2021-03-12 LAB — URINALYSIS, ROUTINE W REFLEX MICROSCOPIC
Bilirubin Urine: NEGATIVE
Glucose, UA: NEGATIVE mg/dL
Hgb urine dipstick: NEGATIVE
Ketones, ur: NEGATIVE mg/dL
Leukocytes,Ua: NEGATIVE
Nitrite: NEGATIVE
Protein, ur: NEGATIVE mg/dL
Specific Gravity, Urine: 1.017 (ref 1.005–1.030)
pH: 5 (ref 5.0–8.0)

## 2021-03-12 LAB — CBC WITH DIFFERENTIAL/PLATELET
Abs Immature Granulocytes: 0.21 10*3/uL — ABNORMAL HIGH (ref 0.00–0.07)
Basophils Absolute: 0.1 10*3/uL (ref 0.0–0.1)
Basophils Relative: 0 %
Eosinophils Absolute: 0.1 10*3/uL (ref 0.0–0.5)
Eosinophils Relative: 1 %
HCT: 40.7 % (ref 39.0–52.0)
Hemoglobin: 14 g/dL (ref 13.0–17.0)
Immature Granulocytes: 1 %
Lymphocytes Relative: 20 %
Lymphs Abs: 3.1 10*3/uL (ref 0.7–4.0)
MCH: 31.7 pg (ref 26.0–34.0)
MCHC: 34.4 g/dL (ref 30.0–36.0)
MCV: 92.3 fL (ref 80.0–100.0)
Monocytes Absolute: 1.3 10*3/uL — ABNORMAL HIGH (ref 0.1–1.0)
Monocytes Relative: 8 %
Neutro Abs: 10.9 10*3/uL — ABNORMAL HIGH (ref 1.7–7.7)
Neutrophils Relative %: 70 %
Platelets: 238 10*3/uL (ref 150–400)
RBC: 4.41 MIL/uL (ref 4.22–5.81)
RDW: 12.1 % (ref 11.5–15.5)
WBC: 15.7 10*3/uL — ABNORMAL HIGH (ref 4.0–10.5)
nRBC: 0 % (ref 0.0–0.2)

## 2021-03-12 LAB — RESP PANEL BY RT-PCR (FLU A&B, COVID) ARPGX2
Influenza A by PCR: NEGATIVE
Influenza B by PCR: NEGATIVE
SARS Coronavirus 2 by RT PCR: NEGATIVE

## 2021-03-12 LAB — BASIC METABOLIC PANEL
Anion gap: 7 (ref 5–15)
BUN: 33 mg/dL — ABNORMAL HIGH (ref 8–23)
CO2: 29 mmol/L (ref 22–32)
Calcium: 9.1 mg/dL (ref 8.9–10.3)
Chloride: 101 mmol/L (ref 98–111)
Creatinine, Ser: 1.25 mg/dL — ABNORMAL HIGH (ref 0.61–1.24)
GFR, Estimated: 59 mL/min — ABNORMAL LOW (ref >60)
Glucose, Bld: 105 mg/dL — ABNORMAL HIGH (ref 70–99)
Potassium: 4.3 mmol/L (ref 3.5–5.1)
Sodium: 137 mmol/L (ref 135–145)

## 2021-03-12 LAB — HEPATIC FUNCTION PANEL
ALT: 20 U/L (ref 0–44)
AST: 16 U/L (ref 15–41)
Albumin: 3.5 g/dL (ref 3.5–5.0)
Alkaline Phosphatase: 72 U/L (ref 38–126)
Bilirubin, Direct: <0.1 mg/dL (ref 0.0–0.2)
Total Bilirubin: 0.9 mg/dL (ref 0.3–1.2)
Total Protein: 6.4 g/dL — ABNORMAL LOW (ref 6.5–8.1)

## 2021-03-12 LAB — TSH: TSH: 0.076 u[IU]/mL — ABNORMAL LOW (ref 0.350–4.500)

## 2021-03-12 LAB — LACTIC ACID, PLASMA: Lactic Acid, Venous: 1.5 mmol/L (ref 0.5–1.9)

## 2021-03-12 LAB — T4, FREE: Free T4: 1.72 ng/dL — ABNORMAL HIGH (ref 0.61–1.12)

## 2021-03-12 LAB — C-REACTIVE PROTEIN: CRP: <0.5 mg/dL (ref <1.0)

## 2021-03-12 MED ORDER — PHENOL 1.4 % MT LIQD
1.0000 | OROMUCOSAL | Status: DC | PRN
  Filled 2021-03-12: qty 177

## 2021-03-12 MED ORDER — PREDNISONE 20 MG PO TABS
40.0000 mg | ORAL_TABLET | Freq: Every day | ORAL | Status: DC
  Administered 2021-03-13: 40 mg via ORAL
  Filled 2021-03-12: qty 2

## 2021-03-12 MED ORDER — ENOXAPARIN SODIUM 40 MG/0.4ML ~~LOC~~ SOLN
40.0000 mg | SUBCUTANEOUS | Status: DC
  Administered 2021-03-12: 40 mg via SUBCUTANEOUS
  Filled 2021-03-12: qty 0.4

## 2021-03-12 MED ORDER — PANTOPRAZOLE SODIUM 40 MG PO TBEC
40.0000 mg | DELAYED_RELEASE_TABLET | Freq: Every day | ORAL | Status: DC
  Administered 2021-03-12 – 2021-03-13 (×2): 40 mg via ORAL
  Filled 2021-03-12 (×2): qty 1

## 2021-03-12 MED ORDER — SODIUM CHLORIDE 0.9 % IV SOLN: INTRAVENOUS | Status: AC

## 2021-03-12 MED ORDER — IPRATROPIUM-ALBUTEROL 0.5-2.5 (3) MG/3ML IN SOLN: 3.0000 mL | RESPIRATORY_TRACT | Status: DC | PRN

## 2021-03-12 MED ORDER — TRAZODONE HCL 100 MG PO TABS
100.0000 mg | ORAL_TABLET | Freq: Every day | ORAL | Status: DC
  Administered 2021-03-12: 100 mg via ORAL
  Filled 2021-03-12: qty 1

## 2021-03-12 MED ORDER — DEXAMETHASONE SODIUM PHOSPHATE 10 MG/ML IJ SOLN
10.0000 mg | Freq: Once | INTRAMUSCULAR | Status: AC
  Administered 2021-03-12: 10 mg via INTRAVENOUS
  Filled 2021-03-12: qty 1

## 2021-03-12 MED ORDER — ACETAMINOPHEN 325 MG PO TABS: 650.0000 mg | ORAL_TABLET | Freq: Four times a day (QID) | ORAL | Status: DC | PRN

## 2021-03-12 MED ORDER — DIAZEPAM 5 MG/ML IJ SOLN
1.0000 mg | Freq: Once | INTRAMUSCULAR | Status: AC
  Administered 2021-03-12: 1 mg via INTRAVENOUS
  Filled 2021-03-12: qty 2

## 2021-03-12 MED ORDER — ASPIRIN 81 MG PO CHEW
81.0000 mg | CHEWABLE_TABLET | Freq: Every day | ORAL | Status: DC
  Administered 2021-03-12 – 2021-03-13 (×2): 81 mg via ORAL
  Filled 2021-03-12 (×2): qty 1

## 2021-03-12 MED ORDER — IOHEXOL 350 MG/ML SOLN
80.0000 mL | Freq: Once | INTRAVENOUS | Status: AC | PRN
  Administered 2021-03-12: 80 mL via INTRAVENOUS

## 2021-03-12 MED ORDER — SODIUM CHLORIDE 0.9% FLUSH: 3.0000 mL | Freq: Two times a day (BID) | INTRAVENOUS | Status: DC

## 2021-03-12 MED ORDER — ACETAMINOPHEN 650 MG RE SUPP: 650.0000 mg | Freq: Four times a day (QID) | RECTAL | Status: DC | PRN

## 2021-03-12 MED ORDER — ONDANSETRON HCL 4 MG/2ML IJ SOLN: 4.0000 mg | Freq: Four times a day (QID) | INTRAMUSCULAR | Status: DC | PRN

## 2021-03-12 MED ORDER — MOMETASONE FURO-FORMOTEROL FUM 200-5 MCG/ACT IN AERO
2.0000 | INHALATION_SPRAY | Freq: Two times a day (BID) | RESPIRATORY_TRACT | Status: DC
  Administered 2021-03-12 – 2021-03-13 (×3): 2 via RESPIRATORY_TRACT
  Filled 2021-03-12: qty 8.8

## 2021-03-12 MED ORDER — ONDANSETRON HCL 4 MG PO TABS: 4.0000 mg | ORAL_TABLET | Freq: Four times a day (QID) | ORAL | Status: DC | PRN

## 2021-03-12 NOTE — ED Provider Notes (Signed)
[name] Provider Note  CSN: [id number] Arrival date & time: 03/11/21 2239  Chief Complaint(s) Shortness of Breath and Urinary Retention  HPI Edward Matthews is a 80 y.o. male with a past medical history listed below including COPD on 4 L nasal cannula at home, dementia with behavioral disturbance and delusions here for difficulty breathing described as something in his throat.  Patient cannot provide details regarding the duration of his symptoms.  He initially reports that the symptoms started today but later reported that the symptoms began yesterday or the day before.  He denies any chest pain.  No alleviating or aggravating factors.  No difficulty swallowing.  No vomiting.  No other physical complaints.  Remainder of history, ROS, and physical exam limited due to patient's condition (dementia).   Level V Caveat.    HPI  Past Medical History Past Medical History:  Diagnosis Date  . COPD (chronic obstructive pulmonary disease) ([place])   . Dementia with behavioral disturbance ([place]) 03/02/2021  . GERD (gastroesophageal reflux disease)   . Hypothyroidism    Patient Active Problem List   Diagnosis Date Noted  . Angioedema 03/12/2021  . COPD with acute exacerbation ([place]) 03/02/2021  . Dementia with behavioral disturbance ([place]) 03/02/2021  . Hypothyroidism (acquired) 03/02/2021  . 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 Paranoia ([place])   . Tobacco abuse   . Chest pain 01/17/2018  . COPD (chronic obstructive pulmonary disease) ([place]) 01/17/2018  . Chronic respiratory failure with hypoxia ([place]) 01/17/2018   Home Medication(s) Prior to Admission medications   Medication Sig Start Date End Date Taking? Authorizing Provider  ADVAIR DISKUS 250-50 MCG/DOSE AEPB Inhale 2 puffs into the lungs 2 (two) times daily.  05/30/17  Yes [provider]  albuterol (VENTOLIN HFA) 108 (90 Base) MCG/ACT inhaler Inhale 1 puff into the lungs every 4  (four) hours as needed for wheezing or shortness of breath.   Yes [provider]  aspirin 81 MG chewable tablet Chew 81 mg by mouth daily. 08/15/18  Yes [provider]  dexlansoprazole (DEXILANT) 60 MG capsule Take 60 mg by mouth daily as needed (acid reflux).   Yes [provider]  guaiFENesin (MUCINEX) 600 MG 12 hr tablet Take 1 tablet (600 mg total) by mouth 2 (two) times daily as needed for cough or to loosen phlegm. 03/03/21  Yes Aline August, MD  levothyroxine (SYNTHROID) 150 MCG tablet Take 150 mcg by mouth daily before breakfast.   Yes [provider]                                                                                                                                    Past Surgical History Past Surgical History:  Procedure Laterality Date  . COLONOSCOPY WITH PROPOFOL N/A 07/28/2017   Procedure: COLONOSCOPY WITH PROPOFOL;  Surgeon: Carol Ada, MD;  Location: [facility];  Service: Endoscopy;  Laterality: N/A;  .  ESOPHAGOGASTRODUODENOSCOPY (EGD) WITH PROPOFOL N/A 07/28/2017   Procedure: ESOPHAGOGASTRODUODENOSCOPY (EGD) WITH PROPOFOL;  Surgeon: Carol Ada, MD;  Location: [facility];  Service: Endoscopy;  Laterality: N/A;  . TONSILLECTOMY     Family History Family History  Problem Relation Age of Onset  . Hypertension Father     Social History Social History   Tobacco Use  . Smoking status: Current Every Day Smoker    Packs/day: 1.50    Years: 63.00    Pack years: 94.50    Types: Cigarettes  . Smokeless tobacco: Never Used  Substance Use Topics  . Alcohol use: Yes    Comment: occasional  . Drug use: Not Currently   Allergies Chantix [varenicline]  Review of Systems Review of Systems All other systems are reviewed and are negative for acute change except as noted in the HPI  Physical Exam Vital Signs  I have reviewed the triage vital signs BP (!) 107/59   Pulse 70   Temp 97.8 F (36.6 C) (Oral)   Resp 19    SpO2 95%   Physical Exam Vitals reviewed.  Constitutional:      General: He is not in acute distress.    Appearance: He is well-developed. He is not diaphoretic.  HENT:     Head: Normocephalic and atraumatic.     Nose: Nose normal.     Mouth/Throat:     Pharynx: Uvula swelling present.  Eyes:     General: No scleral icterus.       Right eye: No discharge.        Left eye: No discharge.     Conjunctiva/sclera: Conjunctivae normal.     Pupils: Pupils are equal, round, and reactive to light.  Cardiovascular:     Rate and Rhythm: Normal rate and regular rhythm.     Heart sounds: No murmur heard. No friction rub. No gallop.   Pulmonary:     Effort: Pulmonary effort is normal. Tachypnea present. No respiratory distress.     Breath sounds: Normal breath sounds. No stridor. No rales.     Comments: Speaking in full sentences w/o increased WOB Does become tachypneic when anxious Abdominal:     General: There is no distension.     Palpations: Abdomen is soft.     Tenderness: There is no abdominal tenderness.  Musculoskeletal:        General: No tenderness.     Cervical back: Normal range of motion and neck supple.  Skin:    General: Skin is warm and dry.     Findings: No erythema or rash.  Neurological:     Mental Status: He is alert and oriented to person, place, and time.     ED Results and Treatments Labs (all labs ordered are listed, but only abnormal results are displayed) Labs Reviewed  CBC WITH DIFFERENTIAL/PLATELET - Abnormal; Notable for the following components:      Result Value   WBC 15.7 (*)    Neutro Abs 10.9 (*)    Monocytes Absolute 1.3 (*)    Abs Immature Granulocytes 0.21 (*)    All other components within normal limits  BASIC METABOLIC PANEL - Abnormal; Notable for the following components:   Glucose, Bld 105 (*)    BUN 33 (*)    Creatinine, Ser 1.25 (*)    GFR, Estimated 59 (*)    All other components within normal limits  RESP PANEL BY RT-PCR (FLU  A&B, COVID) ARPGX2  URINALYSIS, ROUTINE W REFLEX MICROSCOPIC  EKG  EKG Interpretation  Date/Time:  Thursday March 11 2021 22:34:41 EDT Ventricular Rate:  60 PR Interval:    QRS Duration: 81 QT Interval:  391 QTC Calculation: 391 R Axis:   65 Text Interpretation: Sinus rhythm Consider left ventricular hypertrophy No acute changes Confirmed by Addison Lank [phone]) on 03/12/2021 1:04:10 AM      Radiology CT Soft Tissue Neck W Contrast  Result Date: 03/12/2021 CLINICAL DATA:  80 year old male with sore throat, shortness of breath, difficulty urinating. EXAM: CT NECK WITH CONTRAST TECHNIQUE: Multidetector CT imaging of the neck was performed using the standard protocol following the bolus administration of intravenous contrast. CONTRAST:  70mL OMNIPAQUE IOHEXOL 350 MG/ML SOLN COMPARISON:  Head CT 11/16/2020. FINDINGS: Pharynx and larynx: The glottis is closed. Laryngeal soft tissue contours including the epiglottis are within normal limits. The uvula is enlarged in the midline on series 3, image 59. But there is no tonsillar or adenoid hypertrophy. No discrete enhancing mass. No tonsillar hyperenhancement. No inflammatory changes in the parapharyngeal or retropharyngeal spaces. There is mild retained secretions in the nasopharynx. Salivary glands: Sublingual space, submandibular glands and parotid glands are within normal limits. Thyroid: Diminutive, negative. Lymph nodes: Negative.  No cervical lymphadenopathy. Vascular: Major vascular structures in the neck and at the skull base are patent. Dominant left vertebral artery with distal calcified atherosclerosis. Partially retropharyngeal course of the left ICA. Bilateral carotid bifurcation calcified plaque greater on the right. Limited intracranial: Stable visible brain parenchyma. 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 Visualized orbits: Negative. Mastoids and visualized paranasal sinuses: Visualized paranasal sinuses and mastoids are stable and well pneumatized. Skeleton: Poor dentition. Relatively mild for age cervical spine degeneration. No acute osseous abnormality identified. Upper chest: Moderate to severe emphysema. Visible trachea and carina are clear. Calcified aortic atherosclerosis. No superior mediastinal lymphadenopathy. Other: None. IMPRESSION: 1. Swollen uvula. But otherwise negative pharynx and larynx and normal parapharyngeal and retropharyngeal spaces. This might reflect Angioedema rather than a soft palate infection. There is superimposed poor dentition. 2. No other acute finding in the neck. 3. 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 4. Aortic Atherosclerosis (ICD10-I70.0) and Emphysema (ICD10-J43.9). Electronically Signed   By: Genevie Ann M.D.   On: 03/12/2021 05:37   DG Chest Portable 1 View  Result Date: 03/11/2021 CLINICAL DATA:  Short of breath, difficulty urinating EXAM: PORTABLE CHEST 1 VIEW COMPARISON:  03/02/2021 FINDINGS: 2 frontal views of the chest demonstrate an unremarkable cardiac silhouette. Chronic emphysema is again noted, with persistent bibasilar areas of scarring greatest at the right lung base. No acute airspace disease, effusion, or pneumothorax. No acute bony abnormalities. IMPRESSION: 1. Emphysema with bibasilar scarring.  No acute airspace disease. Electronically Signed   By: Randa Ngo M.D.   On: 03/11/2021 23:00    Pertinent labs & imaging results that were available during my care of the patient were reviewed by me and considered in my medical decision making (see chart for details).  Medications Ordered in ED Medications  albuterol (VENTOLIN HFA) 108 (90 Base) MCG/ACT inhaler 6 puff (6 puffs Inhalation Given 03/12/21 0225)  ipratropium  (ATROVENT HFA) inhaler 2 puff (2 puffs Inhalation Given 03/12/21 0225)  benzonatate (TESSALON) capsule 100 mg (100 mg Oral Given 03/12/21 0220)  diazepam (VALIUM) injection 1 mg (1 mg Intravenous Given 03/12/21 0217)  dexamethasone (DECADRON) injection 10 mg (10 mg Intravenous Given  03/12/21 0350)  iohexol (OMNIPAQUE) 350 MG/ML injection 80 mL (80 mLs Intravenous Contrast Given 03/12/21 0520)                                                                                                                                    Procedures Procedures  (including critical care time)  Medical Decision Making / ED Course I have reviewed the nursing notes for this encounter and the patient's prior records (if available in EHR or on provided paperwork).   Gage Weant Arnall was evaluated in Emergency Department on 03/12/2021 for the symptoms described in the history of present illness. He was evaluated in the context of the global COVID-19 pandemic, which necessitated consideration that the patient might be at risk for infection with the SARS-CoV-2 virus that causes COVID-19. Institutional protocols and algorithms that pertain to the evaluation of patients at risk for COVID-19 are in a state of rapid change based on information released by regulatory bodies including the CDC and federal and state organizations. These policies and algorithms were followed during the patient's care in the ED.  Uvular edema noted on exam without evidence of infection.  Lungs relatively clear to auscultation with good air movement. Work-up notable for leukocytosis. Patient does have mild renal sufficiency.  UA without evidence of infection.  Covid/influenza negative.  CT scan obtained and confirmed uvular edema/possible angioedema without source of infection.  Patient is not on any lisinopril. unsure of causative agent. Provided with Decadron. Patient is not in any acute respiratory distress, but admitted to medicine for close  monitoring and further work-up.      Final Clinical Impression(s) / ED Diagnoses Final diagnoses:  Angioedema, initial encounter      This chart was dictated using voice recognition software.  Despite best efforts to proofread,  errors can occur which can change the documentation meaning.   Fatima Blank, MD 03/12/21 [phone]

## 2021-03-12 NOTE — H&P (Addendum)
History and Physical    [name] ACZ:[id number] DOB: Nov 17, 1941 DOA: 03/11/2021  Referring MD/NP/PA: Shela Leff, MD [name], MD  Patient coming from: Home Via EMS  Chief Complaint: "Couldn't breathe"  I have personally briefly reviewed patient's old medical records in [place]   HPI: Edward Matthews is a 80 y.o. male with medical history significant of COPD, chronic respiratory failure on 3.5 L of oxygen, hyperlipidemia, GERD, and dementia with behavioral disturbance presents with complaints of being unable to breathe over the last 2-3 days.  History is somewhat limited from the patient as he has trouble with remembering exact details.  Patient had just recently been hospitalized from 3/8-3/9 for COPD exacerbation discharged home with a 7-day course of prednisone.  Patient reports taking the medication as advised, but seems to think that it made his symptoms worse.  Patient complains of his breathing being worse to the point in which he bumped up his oxygen from 3.5 L to 4 at home.  He complains of still having a intermittently productive cough, wheezing, sore throat making it difficult for him to eat and drink, upset stomach, loose stools, and subjective hot flashs/cold chills.  He reports that he has not smoked a cigarette since being hospitalized last week.  Complains of that in the house is either too cold when the [facility] is on or it is too hot when it is off making his breathing worse.  Denies any recent sick contacts to his knowledge.  Over the phone his son [name] helps provide additional history.  Apparently from the time he woke up yesterday morning at 7 AM till the time he called the ambulance the patient  kept repeating he couldn't breathe and that his throat was clogged up. Son checked his oxygen and noted that he was breathing fine.  Son notes that he had been using his inhalers and even tried giving him some CBD gummies to see if it would help with his  anxiety.  He thinks that the patient is having panic attacks/anxiety that is worsening his breathing and has not been sleeping at night.  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  At that time he was recommended to be on Aricept, Zyprexa, and trazodone.  Son notes that he is no longer taking any of these medications, but states that the trazodone 100 mg dose did help him sleep previously and asked if he could be given something for his anxiety.  He also reports that his dad had been just staying in the bed and needed full assistance.  ED Course: Upon admission into the emergency department patient was seen to be afebrile with respirations 18-23, O2 saturations maintained on 4 L of oxygen, and all other vital signs stable.  Labs significant for WBC 15.7, BUN 33, and creatinine 1.25.  Chest x-ray noted chronic emphysema with bibasilar scarring and no acute infiltrate.  COVID-19 and influenza screening both were negative.  CT imaging of the neck revealed concern for swollen uvula with concern for possible angioedema versus soft palate infection.  He had been given Decadron 10 mg IV, Tessalon Perls, Valium 1 mg IV, and albuterol and ipratropium inhaler.  Review of Systems  Constitutional: Positive for malaise/fatigue. Negative for fever.  HENT: Positive for sore throat. Negative for ear discharge and nosebleeds.   Eyes: Negative for photophobia  and pain.  Respiratory: Positive for cough, sputum production, shortness of breath and wheezing.   Cardiovascular: Negative for chest pain and leg swelling.  Gastrointestinal: Positive for heartburn. Negative for blood in stool, constipation, nausea and vomiting.  Genitourinary: Negative for dysuria and hematuria.  Musculoskeletal: Negative for falls.  Skin: Negative for rash.   Neurological: Positive for weakness. Negative for sensory change.  Psychiatric/Behavioral: Positive for memory loss. Negative for substance abuse. The patient is nervous/anxious and has insomnia.     Past Medical History:  Diagnosis Date  . COPD (chronic obstructive pulmonary disease) ([place])   . Dementia with behavioral disturbance ([place]) 03/02/2021  . GERD (gastroesophageal reflux disease)   . Hypothyroidism     Past Surgical History:  Procedure Laterality Date  . COLONOSCOPY WITH PROPOFOL N/A 07/28/2017   Procedure: COLONOSCOPY WITH PROPOFOL;  Surgeon: Carol Ada, MD;  Location: [facility];  Service: Endoscopy;  Laterality: N/A;  . ESOPHAGOGASTRODUODENOSCOPY (EGD) WITH PROPOFOL N/A 07/28/2017   Procedure: ESOPHAGOGASTRODUODENOSCOPY (EGD) WITH PROPOFOL;  Surgeon: Carol Ada, MD;  Location: [facility];  Service: Endoscopy;  Laterality: N/A;  . TONSILLECTOMY       reports that he has been smoking cigarettes. He has a 94.50 pack-year smoking history. He has never used smokeless tobacco. He reports current alcohol use. He reports previous drug use.  Allergies  Allergen Reactions  . Chantix [Varenicline] Hives    Family History  Problem Relation Age of Onset  . Hypertension Father     Prior to Admission medications   Medication Sig Start Date End Date Taking? Authorizing Provider  ADVAIR DISKUS 250-50 MCG/DOSE AEPB Inhale 2 puffs into the lungs 2 (two) times daily.  05/30/17  Yes [provider]  albuterol (VENTOLIN HFA) 108 (90 Base) MCG/ACT inhaler Inhale 1 puff into the lungs every 4 (four) hours as needed for wheezing or shortness of breath.   Yes [provider]  aspirin 81 MG chewable tablet Chew 81 mg by mouth daily. 08/15/18  Yes [provider]  dexlansoprazole (DEXILANT) 60 MG capsule Take 60 mg by mouth daily as needed (acid reflux).   Yes [provider]  guaiFENesin (MUCINEX) 600 MG 12 hr tablet Take 1 tablet (600 mg total) by mouth  2 (two) times daily as needed for cough or to loosen phlegm. 03/03/21  Yes Aline August, MD  levothyroxine (SYNTHROID) 150 MCG tablet Take 150 mcg by mouth daily before breakfast.   Yes [provider]    Physical Exam:  Constitutional: Elderly male currently in no acute distress Vitals:   03/12/21 0530 03/12/21 0545 03/12/21 0600 03/12/21 0615  BP: 118/64 114/62 128/74 117/63  Pulse: (!) 59 (!) 55 (!) 56 (!) 55  Resp: (!) 21 15 15 19   Temp:      TempSrc:      SpO2: 95% 94% 94% 91%   Eyes: PERRL, lids and conjunctivae normal ENMT: Mucous membranes are dry.  Uvular swelling present.  Posterior pharynx clear of any exudate or lesions.no significant lymphadenopathy.  Very poor dentition with multiple cavities and broken teeth Neck: normal, supple, no masses, no stridor appreciated Respiratory: Decreased aeration, but no significant wheezes or rhonchi appreciated.  Patient currently on 4 L nasal cannula oxygen with O2 saturations 94%. Cardiovascular: Regular rate and rhythm, no murmurs / rubs / gallops. No extremity edema. 2+ pedal pulses. No carotid bruits.  Abdomen: no tenderness, no masses palpated. No hepatosplenomegaly. Bowel sounds positive.  Musculoskeletal: no clubbing / cyanosis. No joint deformity upper and  lower extremities. Good ROM, no contractures. Normal muscle tone.  Skin: no rashes, lesions, ulcers. No induration Neurologic: CN 2-12 grossly intact. Sensation intact, DTR normal. Strength 5/5 in all 4.  Psychiatric: Decreased recent memory. Alert and oriented x person place.  Anxious Mood.  Patient asked several times for me not to close the door all the way    Labs on Admission: I have personally reviewed following labs and imaging studies  CBC: Recent Labs  Lab 03/12/21 0003  WBC 15.7*  NEUTROABS 10.9*  HGB 14.0  HCT 40.7  MCV 92.3  PLT 222   Basic Metabolic Panel: Recent Labs  Lab 03/12/21 0003  NA 137  K 4.3  CL 101  CO2 29  GLUCOSE 105*  BUN  33*  CREATININE 1.25*  CALCIUM 9.1   GFR: Estimated Creatinine Clearance: 42.6 mL/min (A) (by C-G formula based on SCr of 1.25 mg/dL (H)). Liver Function Tests: No results for input(s): AST, ALT, ALKPHOS, BILITOT, PROT, ALBUMIN in the last 168 hours. No results for input(s): LIPASE, AMYLASE in the last 168 hours. No results for input(s): AMMONIA in the last 168 hours. Coagulation Profile: No results for input(s): INR, PROTIME in the last 168 hours. Cardiac Enzymes: No results for input(s): CKTOTAL, CKMB, CKMBINDEX, TROPONINI in the last 168 hours. BNP (last 3 results) No results for input(s): PROBNP in the last 8760 hours. HbA1C: No results for input(s): HGBA1C in the last 72 hours. CBG: No results for input(s): GLUCAP in the last 168 hours. Lipid Profile: No results for input(s): CHOL, HDL, LDLCALC, TRIG, CHOLHDL, LDLDIRECT in the last 72 hours. Thyroid Function Tests: No results for input(s): TSH, T4TOTAL, FREET4, T3FREE, THYROIDAB in the last 72 hours. Anemia Panel: No results for input(s): VITAMINB12, FOLATE, FERRITIN, TIBC, IRON, RETICCTPCT in the last 72 hours. Urine analysis:    Component Value Date/Time   COLORURINE YELLOW 03/12/2021 0353   APPEARANCEUR CLEAR 03/12/2021 0353   LABSPEC 1.017 03/12/2021 0353   PHURINE 5.0 03/12/2021 0353   GLUCOSEU NEGATIVE 03/12/2021 0353   HGBUR NEGATIVE 03/12/2021 0353   BILIRUBINUR NEGATIVE 03/12/2021 0353   KETONESUR NEGATIVE 03/12/2021 0353   PROTEINUR NEGATIVE 03/12/2021 0353   NITRITE NEGATIVE 03/12/2021 0353   LEUKOCYTESUR NEGATIVE 03/12/2021 0353   Sepsis Labs: Recent Results (from the past 240 hour(s))  Resp Panel by RT-PCR (Flu A&B, Covid) Nasopharyngeal Swab     Status: None   Collection Time: 03/02/21 10:59 AM   Specimen: Nasopharyngeal Swab; Nasopharyngeal(NP) swabs in vial transport medium  Result Value Ref Range Status   SARS Coronavirus 2 by RT PCR NEGATIVE NEGATIVE Final    Comment: (NOTE) SARS-CoV-2 target  nucleic acids are NOT DETECTED.  The SARS-CoV-2 RNA is generally detectable in upper respiratory specimens during the acute phase of infection. The lowest concentration of SARS-CoV-2 viral copies this assay can detect is 138 copies/mL. A negative result does not preclude SARS-Cov-2 infection and should not be used as the sole basis for treatment or other patient management decisions. A negative result may occur with  improper specimen collection/handling, submission of specimen other than nasopharyngeal swab, presence of viral mutation(s) within the areas targeted by this assay, and inadequate number of viral copies(<138 copies/mL). A negative result must be combined with clinical observations, patient history, and epidemiological information. The expected result is Negative.  Fact Sheet for Patients:  EntrepreneurPulse.com.au  Fact Sheet for Healthcare Providers:  IncredibleEmployment.be  This test is no t yet approved or cleared by the [place] and  has been authorized for detection and/or diagnosis of SARS-CoV-2 by FDA under an Emergency Use Authorization (EUA). This EUA will remain  in effect (meaning this test can be used) for the duration of the COVID-19 declaration under Section 564(b)(1) of the Act, 21 U.S.C.section 360bbb-3(b)(1), unless the authorization is terminated  or revoked sooner.       Influenza A by PCR NEGATIVE NEGATIVE Final   Influenza B by PCR NEGATIVE NEGATIVE Final    Comment: (NOTE) The Xpert Xpress SARS-CoV-2/FLU/RSV plus assay is intended as an aid in the diagnosis of influenza from Nasopharyngeal swab specimens and should not be used as a sole basis for treatment. Nasal washings and aspirates are unacceptable for Xpert Xpress SARS-CoV-2/FLU/RSV testing.  Fact Sheet for Patients: EntrepreneurPulse.com.au  Fact Sheet for Healthcare  Providers: IncredibleEmployment.be  This test is not yet approved or cleared by the [place] FDA and has been authorized for detection and/or diagnosis of SARS-CoV-2 by FDA under an Emergency Use Authorization (EUA). This EUA will remain in effect (meaning this test can be used) for the duration of the COVID-19 declaration under Section 564(b)(1) of the Act, 21 U.S.C. section 360bbb-3(b)(1), unless the authorization is terminated or revoked.  Performed at [facility], [place] [address], [place], [place] [address]   Resp Panel by RT-PCR (Flu A&B, Covid) Nasopharyngeal Swab     Status: None   Collection Time: 03/12/21  3:26 AM   Specimen: Nasopharyngeal Swab; Nasopharyngeal(NP) swabs in vial transport medium  Result Value Ref Range Status   SARS Coronavirus 2 by RT PCR NEGATIVE NEGATIVE Final    Comment: (NOTE) SARS-CoV-2 target nucleic acids are NOT DETECTED.  The SARS-CoV-2 RNA is generally detectable in upper respiratory specimens during the acute phase of infection. The lowest concentration of SARS-CoV-2 viral copies this assay can detect is 138 copies/mL. A negative result does not preclude SARS-Cov-2 infection and should not be used as the sole basis for treatment or other patient management decisions. A negative result may occur with  improper specimen collection/handling, submission of specimen other than nasopharyngeal swab, presence of viral mutation(s) within the areas targeted by this assay, and inadequate number of viral copies(<138 copies/mL). A negative result must be combined with clinical observations, patient history, and epidemiological information. The expected result is Negative.  Fact Sheet for Patients:  EntrepreneurPulse.com.au  Fact Sheet for Healthcare Providers:  IncredibleEmployment.be  This test is no t yet approved or cleared by the [place] FDA and  has been authorized for  detection and/or diagnosis of SARS-CoV-2 by FDA under an Emergency Use Authorization (EUA). This EUA will remain  in effect (meaning this test can be used) for the duration of the COVID-19 declaration under Section 564(b)(1) of the Act, 21 U.S.C.section 360bbb-3(b)(1), unless the authorization is terminated  or revoked sooner.       Influenza A by PCR NEGATIVE NEGATIVE Final   Influenza B by PCR NEGATIVE NEGATIVE Final    Comment: (NOTE) The Xpert Xpress SARS-CoV-2/FLU/RSV plus assay is intended as an aid in the diagnosis of influenza from Nasopharyngeal swab specimens and should not be used as a sole basis for treatment. Nasal washings and aspirates are unacceptable for Xpert Xpress SARS-CoV-2/FLU/RSV testing.  Fact Sheet for Patients: EntrepreneurPulse.com.au  Fact Sheet for Healthcare Providers: IncredibleEmployment.be  This test is not yet approved or cleared by the [place] FDA and has been authorized for detection and/or diagnosis of SARS-CoV-2 by FDA under an Emergency Use Authorization (EUA). This EUA will remain  in effect (meaning this test can be used) for the duration of the COVID-19 declaration under Section 564(b)(1) of the Act, 21 U.S.C. section 360bbb-3(b)(1), unless the authorization is terminated or revoked.  Performed at [facility], [place] [address], [place], [place] [address]      Radiological Exams on Admission: CT Soft Tissue Neck W Contrast  Result Date: 03/12/2021 CLINICAL DATA:  80 year old male with sore throat, shortness of breath, difficulty urinating. EXAM: CT NECK WITH CONTRAST TECHNIQUE: Multidetector CT imaging of the neck was performed using the standard protocol following the bolus administration of intravenous contrast. CONTRAST:  63mL OMNIPAQUE IOHEXOL 350 MG/ML SOLN COMPARISON:  Head CT 11/16/2020. FINDINGS: Pharynx and larynx: The glottis is closed. Laryngeal soft tissue contours including the  epiglottis are within normal limits. The uvula is enlarged in the midline on series 3, image 59. But there is no tonsillar or adenoid hypertrophy. No discrete enhancing mass. No tonsillar hyperenhancement. No inflammatory changes in the parapharyngeal or retropharyngeal spaces. There is mild retained secretions in the nasopharynx. Salivary glands: Sublingual space, submandibular glands and parotid glands are within normal limits. Thyroid: Diminutive, negative. Lymph nodes: Negative.  No cervical lymphadenopathy. Vascular: Major vascular structures in the neck and at the skull base are patent. Dominant left vertebral artery with distal calcified atherosclerosis. Partially retropharyngeal course of the left ICA. Bilateral carotid bifurcation calcified plaque greater on the right. Limited intracranial: Stable visible brain parenchyma. 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 Visualized orbits: Negative. Mastoids and visualized paranasal sinuses: Visualized paranasal sinuses and mastoids are stable and well pneumatized. Skeleton: Poor dentition. Relatively mild for age cervical spine degeneration. No acute osseous abnormality identified. Upper chest: Moderate to severe emphysema. Visible trachea and carina are clear. Calcified aortic atherosclerosis. No superior mediastinal lymphadenopathy. Other: None. IMPRESSION: 1. Swollen uvula. But otherwise negative pharynx and larynx and normal parapharyngeal and retropharyngeal spaces. This might reflect Angioedema rather than a soft palate infection. There is superimposed poor dentition. 2. No other acute finding in the neck. 3. 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 4. Aortic Atherosclerosis (ICD10-I70.0) and Emphysema (ICD10-J43.9). Electronically Signed   By: Genevie Ann M.D.   On: 03/12/2021 05:37   DG Chest Portable 1 View  Result Date:  03/11/2021 CLINICAL DATA:  Short of breath, difficulty urinating EXAM: PORTABLE CHEST 1 VIEW COMPARISON:  03/02/2021 FINDINGS: 2 frontal views of the chest demonstrate an unremarkable cardiac silhouette. Chronic emphysema is again noted, with persistent bibasilar areas of scarring greatest at the right lung base. No acute airspace disease, effusion, or pneumothorax. No acute bony abnormalities. IMPRESSION: 1. Emphysema with bibasilar scarring.  No acute airspace disease. Electronically Signed   By: Randa Ngo M.D.   On: 03/11/2021 23:00    EKG: Independently reviewed.  Sinus rhythm at 60 bpm  Assessment/Plan Uvulitis: Acute.  Patient presents with complaints of was noted to have uvular swelling on CT imaging of the neck, but otherwise normal pharynx, larynx, parapharyngeal, and retropharyngeal spaces.  Impression noted the possibility of angioedema rather than a soft palate infection.  Influenza and COVID-19 screening have been negative.  He has been given 10 mg of Decadron IV.  On differential includes possibility of infection(viral, common cold, etc) or seems less like angioedema. -Admit to a medical telemetry bed  -Aspiration precautions -Monitor for worsening oral swelling and signs of stridor -Soft diet and  advance as tolerated -Chloraseptic Spray as needed -Prednisone 40 mg daily -Monitor off antibiotics for now unless  Leukocytosis: Trending up. WBC during last admission on 3/9 was 12.7, but acutely higher at 15.7.  Patient did meet SIRS criteria with elevated respiratory rate as well.  Unclear -Check CRP -Recheck CBC in a.m.  COPD, without exacerbation Chronic respiratory failure with hypoxia: Patient presents with complaints of worsening shortness of breath.  On physical exam no significant wheezing or rhonchi appreciated and chest x-ray otherwise stable.  Patient currently on 4 L of oxygen with O2 saturations hovering around 94%.  Patient has not recently seen a  pulmonologist. -Continue pharmacy substitution for Advair -DuoNebs as needed for shortness of breath/wheezing  Renal insufficiency: Acute.  Patient notes that he had not been eating and drinking well due to the throat discomfort with creatinine 1.25 with BUN 33.  The elevated BUN to creatinine ratio gives concern for prerenal cause of symptoms. -Gentle IV fluids at 75 mL/h for 1 L -Recheck kidney function in a.m.  Hypothyroidism: Last TSH was 0.017 on 03/02/2021.  Patient has been on levothyroxine 150 mcg daily.  Given how low patient TSH was it seems recommended to decrease dose. -Will check TSH and free T4 dose to help determine dose reduction needed -Will consider decreasing dose of levothyroxine depending on the degree of elevation of free T4  Generalized weakness: Acute.  Patient was not getting up and get around like usual. -PT OT to evaluate and treat   Dementia: Patient currently not taking any other medications previous prescribed during his admission in behavioral health last November.  Question if some of patient's symptoms may be related to improper dose of levothyroxine -Consider consulting psych or adding back previous medications recommended like Zyprexa and Aricept   Insomnia: Patient son notes that the patient has not been sleeping well. -Restart trazodone  Tobacco abuse: Patient reports that he has not smoked since being hospitalized last -Continue to counsel on need of cessation of tobacco use. DVT prophylaxis: Lovenox Code Status: Full Family Communication: Roselyn Reef updated over the phone Disposition Plan: To be determined Consults called: None Admission status: Observation, requiring less than 24 to 48 hours of hospitalization  Norval Morton MD [facility]   If 7PM-7AM, please contact night-coverage   03/12/2021, 7:47 AM

## 2021-03-12 NOTE — Plan of Care (Signed)
  Problem: Health Behavior/Discharge Planning: Goal: Ability to manage health-related needs will improve Outcome: Progressing   

## 2021-03-12 NOTE — ED Notes (Signed)
Pt refusing all meds at this time, Pt states these meds are going to make it worse, Notified MD

## 2021-03-13 DIAGNOSIS — K122 Cellulitis and abscess of mouth: Secondary | ICD-10-CM | POA: Diagnosis not present

## 2021-03-13 LAB — CBC WITH DIFFERENTIAL/PLATELET
Abs Immature Granulocytes: 0.1 10*3/uL — ABNORMAL HIGH (ref 0.00–0.07)
Basophils Absolute: 0 10*3/uL (ref 0.0–0.1)
Basophils Relative: 0 %
Eosinophils Absolute: 0 10*3/uL (ref 0.0–0.5)
Eosinophils Relative: 0 %
HCT: 34.7 % — ABNORMAL LOW (ref 39.0–52.0)
Hemoglobin: 11.9 g/dL — ABNORMAL LOW (ref 13.0–17.0)
Immature Granulocytes: 1 %
Lymphocytes Relative: 14 %
Lymphs Abs: 1.9 10*3/uL (ref 0.7–4.0)
MCH: 31.2 pg (ref 26.0–34.0)
MCHC: 34.3 g/dL (ref 30.0–36.0)
MCV: 91.1 fL (ref 80.0–100.0)
Monocytes Absolute: 1 10*3/uL (ref 0.1–1.0)
Monocytes Relative: 8 %
Neutro Abs: 10.8 10*3/uL — ABNORMAL HIGH (ref 1.7–7.7)
Neutrophils Relative %: 77 %
Platelets: 181 10*3/uL (ref 150–400)
RBC: 3.81 MIL/uL — ABNORMAL LOW (ref 4.22–5.81)
RDW: 12.4 % (ref 11.5–15.5)
WBC: 13.9 10*3/uL — ABNORMAL HIGH (ref 4.0–10.5)
nRBC: 0 % (ref 0.0–0.2)

## 2021-03-13 LAB — BASIC METABOLIC PANEL
Anion gap: 5 (ref 5–15)
BUN: 35 mg/dL — ABNORMAL HIGH (ref 8–23)
CO2: 27 mmol/L (ref 22–32)
Calcium: 8.3 mg/dL — ABNORMAL LOW (ref 8.9–10.3)
Chloride: 106 mmol/L (ref 98–111)
Creatinine, Ser: 1.15 mg/dL (ref 0.61–1.24)
GFR, Estimated: >60 mL/min (ref >60)
Glucose, Bld: 114 mg/dL — ABNORMAL HIGH (ref 70–99)
Potassium: 4 mmol/L (ref 3.5–5.1)
Sodium: 138 mmol/L (ref 135–145)

## 2021-03-13 MED ORDER — LEVOTHYROXINE SODIUM 125 MCG PO TABS: 125.0000 ug | ORAL_TABLET | Freq: Every day | ORAL | 0 refills | Status: AC

## 2021-03-13 MED ORDER — FLUTICASONE PROPIONATE 50 MCG/ACT NA SUSP: 1.0000 | Freq: Every day | NASAL | 2 refills | Status: AC

## 2021-03-13 MED ORDER — PREDNISONE 10 MG PO TABS: ORAL_TABLET | ORAL | 0 refills | Status: AC

## 2021-03-13 MED ORDER — HYDROXYZINE HCL 10 MG PO TABS: 10.0000 mg | ORAL_TABLET | Freq: Three times a day (TID) | ORAL | 0 refills | Status: AC | PRN

## 2021-03-13 MED ORDER — FLUTICASONE PROPIONATE 50 MCG/ACT NA SUSP
1.0000 | Freq: Every day | NASAL | Status: DC
  Filled 2021-03-13: qty 16

## 2021-03-13 MED ORDER — TRAZODONE HCL 100 MG PO TABS: 100.0000 mg | ORAL_TABLET | Freq: Every day | ORAL | 0 refills | Status: AC

## 2021-03-13 NOTE — Progress Notes (Signed)
DISCHARGE NOTE HOME Edward Matthews to be discharged Home per MD order. Discussed prescriptions and follow up appointments with the patient. Prescriptions given to patient; medication list explained in detail. Patient verbalized understanding.  Skin clean, dry and intact without evidence of skin break down, no evidence of skin tears noted. IV catheter discontinued intact. Site without signs and symptoms of complications. Dressing and pressure applied. Pt denies pain at the site currently. No complaints noted.  Patient free of lines, drains, and wounds.   An After Visit Summary (AVS) was printed and given to the patient. Patient escorted via wheelchair, and discharged home via private auto.  [name], RN

## 2021-03-13 NOTE — Discharge Summary (Signed)
Physician Discharge Summary  Edward Matthews GQQ:[id number] DOB: Mar 31, 1941 DOA: 03/11/2021  PCP: Edward Gravel, MD  Admit date: 03/11/2021 Discharge date: 03/13/2021  Admitted From: Home Disposition: Home  Recommendations for Outpatient Follow-up:  1. Follow up with PCP in 1-2 weeks   Home Health: Not applicable Equipment/Devices: Oxygen present at home.  Discharge Condition: Stable. CODE STATUS: Full code Diet recommendation: Low-salt diet  Discharge summary: 80 year old gentleman with history of COPD and chronic hypoxemic respiratory failure on 3.5 L oxygen at home, GERD, hyperlipidemia, recently diagnosed dementia with behavioral disturbances, recent admission with COPD exacerbation presented back to the ER with complaints of difficulty breathing for last few days.  Patient lives at home with his son, has been complaining of multiple symptoms.  Patient kept repeating that he could not breathe and his throat is clogged up so son called EMS and he was brought to ER. In the emergency room, he is on 4 L oxygen.  Fairly stable.  No new findings.  CT scan of the soft tissue of the neck showed mild swelling of the uvula without any abscess or collection.  He was treated with dexamethasone and then prednisone with improvement of symptoms.  Patient had leukocytosis, however he was recently taking prednisone.  No evidence of bacterial infection.  Patient is adequately stabilized.  Today he is asymptomatic.  Most of symptoms are chronic.  He is on 3 to 4 L of oxygen.  Has no evidence of airway compromise, has no evidence of dysphagia.  No obviously visible swelling of uvula or tonsils on oropharyngeal exam.  Plan: We will discharge patient with tapering dose of prednisone for 1 week to help with any inflammation or allergies. Patient does complain of stuffy nose, will prescribe Flonase. He does have adequate medications for his COPD at home that he will continue. TSH is very low, T4 is mildly  elevated.  Over replaced.  Will decrease thyroxine doses from 150 to 125 mcg daily. Patient requested nighttime medicine to help him sleep, previously done trazodone that helped his symptoms.  Prescription sent to the pharmacy. His son also expressed concern about his episodic anxiety, will prescribe low-dose of hydroxyzine to see if that helps.  Patient is up about.  Walking.  Ready to go home.      Discharge Diagnoses:  Principal Problem:   Uvulitis Active Problems:   Chronic respiratory failure with hypoxia (HCC)   COPD with acute exacerbation (HCC)   Dementia with behavioral disturbance (HCC)   Hypothyroidism (acquired)   Renal insufficiency   Leukocytosis    Discharge Instructions  Discharge Instructions    Call MD for:  difficulty breathing, headache or visual disturbances   Complete by: As directed    Diet - low sodium heart healthy   Complete by: As directed    Increase activity slowly   Complete by: As directed      Allergies as of 03/13/2021      Reactions   Chantix [varenicline] Hives      Medication List    TAKE these medications   Advair Diskus 250-50 MCG/DOSE Aepb Generic drug: Fluticasone-Salmeterol Inhale 2 puffs into the lungs 2 (two) times daily.   albuterol 108 (90 Base) MCG/ACT inhaler Commonly known as: VENTOLIN HFA Inhale 1 puff into the lungs every 4 (four) hours as needed for wheezing or shortness of breath.   aspirin 81 MG chewable tablet Chew 81 mg by mouth daily.   dexlansoprazole 60 MG capsule Commonly known as: DEXILANT Take 60  mg by mouth daily as needed (acid reflux).   fluticasone 50 MCG/ACT nasal spray Commonly known as: FLONASE Place 1 spray into both nostrils daily.   guaiFENesin 600 MG 12 hr tablet Commonly known as: MUCINEX Take 1 tablet (600 mg total) by mouth 2 (two) times daily as needed for cough or to loosen phlegm.   hydrOXYzine 10 MG tablet Commonly known as: ATARAX/VISTARIL Take 1 tablet (10 mg total) by  mouth 3 (three) times daily as needed for anxiety.   levothyroxine 125 MCG tablet Commonly known as: SYNTHROID Take 1 tablet (125 mcg total) by mouth daily before breakfast. What changed:   medication strength  how much to take   predniSONE 10 MG tablet Commonly known as: DELTASONE 4 tabs for 2 days, 3 tabs for 2 days, 2 tabs for 2 days, 1 tabs for 2 days What changed:   medication strength  how much to take  how to take this  when to take this  additional instructions   traZODone 100 MG tablet Commonly known as: DESYREL Take 1 tablet (100 mg total) by mouth at bedtime.       Follow-up Information    Edward Gravel, MD. Schedule an appointment as soon as possible for a visit in 2 week(s).   Specialty: Internal Medicine Contact information: [address] [place] [address] [phone]              Allergies  Allergen Reactions  . Chantix [Varenicline] Hives    Consultations:  None.   Procedures/Studies: CT Soft Tissue Neck W Contrast  Result Date: 03/12/2021 CLINICAL DATA:  80 year old male with sore throat, shortness of breath, difficulty urinating. EXAM: CT NECK WITH CONTRAST TECHNIQUE: Multidetector CT imaging of the neck was performed using the standard protocol following the bolus administration of intravenous contrast. CONTRAST:  40mL OMNIPAQUE IOHEXOL 350 MG/ML SOLN COMPARISON:  Head CT 11/16/2020. FINDINGS: Pharynx and larynx: The glottis is closed. Laryngeal soft tissue contours including the epiglottis are within normal limits. The uvula is enlarged in the midline on series 3, image 59. But there is no tonsillar or adenoid hypertrophy. No discrete enhancing mass. No tonsillar hyperenhancement. No inflammatory changes in the parapharyngeal or retropharyngeal spaces. There is mild retained secretions in the nasopharynx. Salivary glands: Sublingual space, submandibular glands and parotid glands are within normal limits. Thyroid:  Diminutive, negative. Lymph nodes: Negative.  No cervical lymphadenopathy. Vascular: Major vascular structures in the neck and at the skull base are patent. Dominant left vertebral artery with distal calcified atherosclerosis. Partially retropharyngeal course of the left ICA. Bilateral carotid bifurcation calcified plaque greater on the right. Limited intracranial: Stable visible brain parenchyma. 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 Visualized orbits: Negative. Mastoids and visualized paranasal sinuses: Visualized paranasal sinuses and mastoids are stable and well pneumatized. Skeleton: Poor dentition. Relatively mild for age cervical spine degeneration. No acute osseous abnormality identified. Upper chest: Moderate to severe emphysema. Visible trachea and carina are clear. Calcified aortic atherosclerosis. No superior mediastinal lymphadenopathy. Other: None. IMPRESSION: 1. Swollen uvula. But otherwise negative pharynx and larynx and normal parapharyngeal and retropharyngeal spaces. This might reflect Angioedema rather than a soft palate infection. There is superimposed poor dentition. 2. No other acute finding in the neck. 3. 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 4. Aortic Atherosclerosis (ICD10-I70.0) and Emphysema (ICD10-J43.9). Electronically Signed   By: Lemmie Evens  [place] Crane M.D.   On: 03/12/2021 05:37   DG Chest Portable 1 View  Result Date: 03/11/2021 CLINICAL DATA:  Short of breath, difficulty urinating EXAM: PORTABLE CHEST 1 VIEW COMPARISON:  03/02/2021 FINDINGS: 2 frontal views of the chest demonstrate an unremarkable cardiac silhouette. Chronic emphysema is again noted, with persistent bibasilar areas of scarring greatest at the right lung base. No acute airspace disease, effusion, or pneumothorax. No acute bony abnormalities. IMPRESSION: 1. Emphysema with bibasilar scarring.   No acute airspace disease. Electronically Signed   By: Randa Ngo M.D.   On: 03/11/2021 23:00   DG Chest Portable 1 View  Result Date: 03/02/2021 CLINICAL DATA:  Dyspnea EXAM: PORTABLE CHEST 1 VIEW COMPARISON:  11/16/2020 FINDINGS: The lungs are symmetrically hyperinflated. Right basilar consolidation has resolved. Subtle reticular infiltrate is noted within the lungs diffusely demonstrating a basilar predominance, left greater than right, new since prior examination possibly representing mild interstitial pulmonary edema superimposed upon underlying interstitial lung disease or, less likely, atypical infection. There is no pneumothorax or pleural effusion. Cardiac size within normal limits. The central pulmonary arteries are enlarged in keeping with changes of pulmonary arterial hypertension, unchanged. No acute bone abnormality. IMPRESSION: Resolved right basilar consolidation. Subtle diffuse but basilar predominant reticular infiltrate most suggestive of trace pulmonary edema superimposed upon interstitial lung disease. Electronically Signed   By: Fidela Salisbury MD   On: 03/02/2021 06:58   (Echo, Carotid, EGD, Colonoscopy, ERCP)    Subjective: Patient seen and examined.  No overnight events.  He tells me that he needs to get out of the bed and walk to straighten his back.  Denies any chest pain or shortness of breath.  Denies any wheezing.  Has some nose congestion, denies any throat pain or difficulty swallowing. Called and discussed with his son about discharge plan to home and new medication.   Discharge Exam: Vitals:   03/13/21 0801 03/13/21 0900  BP:  123/66  Pulse:  60  Resp:  18  Temp:  98.2 F (36.8 C)  SpO2: 94% 96%   Vitals:   03/12/21 2315 03/13/21 0416 03/13/21 0801 03/13/21 0900  BP: 105/64 (!) 120/59  123/66  Pulse: (!) 54 (!) 55  60  Resp:  18  18  Temp: 97.7 F (36.5 C) 97.7 F (36.5 C)  98.2 F (36.8 C)  TempSrc: Oral Oral  Oral  SpO2: 96% 96% 94% 96%  Weight:       Height:        General: Pt is alert, awake, not in acute distress Chronically sick looking.  Not in any distress. Visible oropharynx with no swelling or edema. Is on 3 L of oxygen and looks comfortable.  No added sounds. Cardiovascular: RRR, S1/S2 +, no rubs, no gallops Respiratory: CTA bilaterally, no wheezing, no rhonchi Abdominal: Soft, NT, ND, bowel sounds + Extremities: no edema, no cyanosis    The results of significant diagnostics from this hospitalization (including imaging, microbiology, ancillary and laboratory) are listed below for reference.     Microbiology: Recent Results (from the past 240 hour(s))  Resp Panel by RT-PCR (Flu A&B, Covid) Nasopharyngeal Swab     Status: None   Collection Time: 03/12/21  3:26 AM   Specimen: Nasopharyngeal Swab; Nasopharyngeal(NP) swabs in vial transport medium  Result Value Ref Range Status   SARS Coronavirus 2 by RT PCR NEGATIVE NEGATIVE Final    Comment: (NOTE) SARS-CoV-2 target nucleic acids are NOT DETECTED.  The SARS-CoV-2 RNA is generally detectable in upper respiratory  specimens during the acute phase of infection. The lowest concentration of SARS-CoV-2 viral copies this assay can detect is 138 copies/mL. A negative result does not preclude SARS-Cov-2 infection and should not be used as the sole basis for treatment or other patient management decisions. A negative result may occur with  improper specimen collection/handling, submission of specimen other than nasopharyngeal swab, presence of viral mutation(s) within the areas targeted by this assay, and inadequate number of viral copies(<138 copies/mL). A negative result must be combined with clinical observations, patient history, and epidemiological information. The expected result is Negative.  Fact Sheet for Patients:  EntrepreneurPulse.com.au  Fact Sheet for Healthcare Providers:  IncredibleEmployment.be  This test is no t  yet approved or cleared by the [place] FDA and  has been authorized for detection and/or diagnosis of SARS-CoV-2 by FDA under an Emergency Use Authorization (EUA). This EUA will remain  in effect (meaning this test can be used) for the duration of the COVID-19 declaration under Section 564(b)(1) of the Act, 21 U.S.C.section 360bbb-3(b)(1), unless the authorization is terminated  or revoked sooner.       Influenza A by PCR NEGATIVE NEGATIVE Final   Influenza B by PCR NEGATIVE NEGATIVE Final    Comment: (NOTE) The Xpert Xpress SARS-CoV-2/FLU/RSV plus assay is intended as an aid in the diagnosis of influenza from Nasopharyngeal swab specimens and should not be used as a sole basis for treatment. Nasal washings and aspirates are unacceptable for Xpert Xpress SARS-CoV-2/FLU/RSV testing.  Fact Sheet for Patients: EntrepreneurPulse.com.au  Fact Sheet for Healthcare Providers: IncredibleEmployment.be  This test is not yet approved or cleared by the [place] FDA and has been authorized for detection and/or diagnosis of SARS-CoV-2 by FDA under an Emergency Use Authorization (EUA). This EUA will remain in effect (meaning this test can be used) for the duration of the COVID-19 declaration under Section 564(b)(1) of the Act, 21 U.S.C. section 360bbb-3(b)(1), unless the authorization is terminated or revoked.  Performed at [facility], [place] [address], [place], [place] [address]      Labs: BNP (last 3 results) No results for input(s): BNP in the last 8760 hours. Basic Metabolic Panel: Recent Labs  Lab 03/12/21 0003 03/13/21 0239  NA 137 138  K 4.3 4.0  CL 101 106  CO2 29 27  GLUCOSE 105* 114*  BUN 33* 35*  CREATININE 1.25* 1.15  CALCIUM 9.1 8.3*   Liver Function Tests: Recent Labs  Lab 03/12/21 0805  AST 16  ALT 20  ALKPHOS 72  BILITOT 0.9  PROT 6.4*  ALBUMIN 3.5   No results for input(s): LIPASE, AMYLASE in the  last 168 hours. No results for input(s): AMMONIA in the last 168 hours. CBC: Recent Labs  Lab 03/12/21 0003 03/13/21 0239  WBC 15.7* 13.9*  NEUTROABS 10.9* 10.8*  HGB 14.0 11.9*  HCT 40.7 34.7*  MCV 92.3 91.1  PLT 238 181   Cardiac Enzymes: No results for input(s): CKTOTAL, CKMB, CKMBINDEX, TROPONINI in the last 168 hours. BNP: Invalid input(s): POCBNP CBG: No results for input(s): GLUCAP in the last 168 hours. D-Dimer No results for input(s): DDIMER in the last 72 hours. Hgb A1c No results for input(s): HGBA1C in the last 72 hours. Lipid Profile No results for input(s): CHOL, HDL, LDLCALC, TRIG, CHOLHDL, LDLDIRECT in the last 72 hours. Thyroid function studies Recent Labs    03/12/21 0814  TSH 0.076*   Anemia work up No results for input(s): VITAMINB12, FOLATE, FERRITIN, TIBC, IRON, RETICCTPCT  in the last 72 hours. Urinalysis    Component Value Date/Time   COLORURINE YELLOW 03/12/2021 0353   APPEARANCEUR CLEAR 03/12/2021 0353   LABSPEC 1.017 03/12/2021 0353   PHURINE 5.0 03/12/2021 0353   GLUCOSEU NEGATIVE 03/12/2021 0353   HGBUR NEGATIVE 03/12/2021 0353   BILIRUBINUR NEGATIVE 03/12/2021 0353   KETONESUR NEGATIVE 03/12/2021 0353   PROTEINUR NEGATIVE 03/12/2021 0353   NITRITE NEGATIVE 03/12/2021 0353   LEUKOCYTESUR NEGATIVE 03/12/2021 0353   Sepsis Labs Invalid input(s): PROCALCITONIN,  WBC,  LACTICIDVEN Microbiology Recent Results (from the past 240 hour(s))  Resp Panel by RT-PCR (Flu A&B, Covid) Nasopharyngeal Swab     Status: None   Collection Time: 03/12/21  3:26 AM   Specimen: Nasopharyngeal Swab; Nasopharyngeal(NP) swabs in vial transport medium  Result Value Ref Range Status   SARS Coronavirus 2 by RT PCR NEGATIVE NEGATIVE Final    Comment: (NOTE) SARS-CoV-2 target nucleic acids are NOT DETECTED.  The SARS-CoV-2 RNA is generally detectable in upper respiratory specimens during the acute phase of infection. The lowest concentration of SARS-CoV-2  viral copies this assay can detect is 138 copies/mL. A negative result does not preclude SARS-Cov-2 infection and should not be used as the sole basis for treatment or other patient management decisions. A negative result may occur with  improper specimen collection/handling, submission of specimen other than nasopharyngeal swab, presence of viral mutation(s) within the areas targeted by this assay, and inadequate number of viral copies(<138 copies/mL). A negative result must be combined with clinical observations, patient history, and epidemiological information. The expected result is Negative.  Fact Sheet for Patients:  EntrepreneurPulse.com.au  Fact Sheet for Healthcare Providers:  IncredibleEmployment.be  This test is no t yet approved or cleared by the [place] FDA and  has been authorized for detection and/or diagnosis of SARS-CoV-2 by FDA under an Emergency Use Authorization (EUA). This EUA will remain  in effect (meaning this test can be used) for the duration of the COVID-19 declaration under Section 564(b)(1) of the Act, 21 U.S.C.section 360bbb-3(b)(1), unless the authorization is terminated  or revoked sooner.       Influenza A by PCR NEGATIVE NEGATIVE Final   Influenza B by PCR NEGATIVE NEGATIVE Final    Comment: (NOTE) The Xpert Xpress SARS-CoV-2/FLU/RSV plus assay is intended as an aid in the diagnosis of influenza from Nasopharyngeal swab specimens and should not be used as a sole basis for treatment. Nasal washings and aspirates are unacceptable for Xpert Xpress SARS-CoV-2/FLU/RSV testing.  Fact Sheet for Patients: EntrepreneurPulse.com.au  Fact Sheet for Healthcare Providers: IncredibleEmployment.be  This test is not yet approved or cleared by the [place] FDA and has been authorized for detection and/or diagnosis of SARS-CoV-2 by FDA under an Emergency Use Authorization  (EUA). This EUA will remain in effect (meaning this test can be used) for the duration of the COVID-19 declaration under Section 564(b)(1) of the Act, 21 U.S.C. section 360bbb-3(b)(1), unless the authorization is terminated or revoked.  Performed at [facility], [place] [address] [place], [place] [address]      Time coordinating discharge: 28 minutes  SIGNED:   Barb Merino, MD  [facility] 03/13/2021, 2:16 PM

## 2021-03-16 ENCOUNTER — Emergency Department (HOSPITAL_COMMUNITY): Payer: PPO

## 2021-03-16 ENCOUNTER — Emergency Department (HOSPITAL_COMMUNITY)
Admission: EM | Admit: 2021-03-16 | Discharge: 2021-03-16 | Disposition: A | Discharge status: Home / Self Care | Payer: PPO | Attending: Emergency Medicine | Admitting: Emergency Medicine

## 2021-03-16 ENCOUNTER — Other Ambulatory Visit: Payer: Self-pay

## 2021-03-16 DIAGNOSIS — J439 Emphysema, unspecified: Secondary | ICD-10-CM | POA: Diagnosis not present

## 2021-03-16 DIAGNOSIS — E039 Hypothyroidism, unspecified: Secondary | ICD-10-CM | POA: Insufficient documentation

## 2021-03-16 DIAGNOSIS — I10 Essential (primary) hypertension: Secondary | ICD-10-CM | POA: Diagnosis not present

## 2021-03-16 DIAGNOSIS — J449 Chronic obstructive pulmonary disease, unspecified: Secondary | ICD-10-CM | POA: Diagnosis not present

## 2021-03-16 DIAGNOSIS — Z7982 Long term (current) use of aspirin: Secondary | ICD-10-CM | POA: Diagnosis not present

## 2021-03-16 DIAGNOSIS — R059 Cough, unspecified: Secondary | ICD-10-CM | POA: Diagnosis not present

## 2021-03-16 DIAGNOSIS — R0602 Shortness of breath: Secondary | ICD-10-CM | POA: Diagnosis not present

## 2021-03-16 DIAGNOSIS — R531 Weakness: Secondary | ICD-10-CM | POA: Diagnosis not present

## 2021-03-16 DIAGNOSIS — F0391 Unspecified dementia with behavioral disturbance: Secondary | ICD-10-CM | POA: Diagnosis not present

## 2021-03-16 DIAGNOSIS — K1379 Other lesions of oral mucosa: Secondary | ICD-10-CM | POA: Diagnosis not present

## 2021-03-16 DIAGNOSIS — F17210 Nicotine dependence, cigarettes, uncomplicated: Secondary | ICD-10-CM | POA: Insufficient documentation

## 2021-03-16 DIAGNOSIS — R221 Localized swelling, mass and lump, neck: Secondary | ICD-10-CM | POA: Diagnosis not present

## 2021-03-16 DIAGNOSIS — J392 Other diseases of pharynx: Secondary | ICD-10-CM | POA: Diagnosis not present

## 2021-03-16 DIAGNOSIS — Z79899 Other long term (current) drug therapy: Secondary | ICD-10-CM | POA: Insufficient documentation

## 2021-03-16 DIAGNOSIS — J387 Other diseases of larynx: Secondary | ICD-10-CM | POA: Diagnosis not present

## 2021-03-16 LAB — COMPREHENSIVE METABOLIC PANEL
ALT: 18 U/L (ref 0–44)
AST: 12 U/L — ABNORMAL LOW (ref 15–41)
Albumin: 3.2 g/dL — ABNORMAL LOW (ref 3.5–5.0)
Alkaline Phosphatase: 66 U/L (ref 38–126)
Anion gap: 4 — ABNORMAL LOW (ref 5–15)
BUN: 22 mg/dL (ref 8–23)
CO2: 33 mmol/L — ABNORMAL HIGH (ref 22–32)
Calcium: 8.6 mg/dL — ABNORMAL LOW (ref 8.9–10.3)
Chloride: 101 mmol/L (ref 98–111)
Creatinine, Ser: 0.98 mg/dL (ref 0.61–1.24)
GFR, Estimated: >60 mL/min (ref >60)
Glucose, Bld: 94 mg/dL (ref 70–99)
Potassium: 4.4 mmol/L (ref 3.5–5.1)
Sodium: 138 mmol/L (ref 135–145)
Total Bilirubin: 0.6 mg/dL (ref 0.3–1.2)
Total Protein: 5.7 g/dL — ABNORMAL LOW (ref 6.5–8.1)

## 2021-03-16 LAB — CBC WITH DIFFERENTIAL/PLATELET
Abs Immature Granulocytes: 0.13 10*3/uL — ABNORMAL HIGH (ref 0.00–0.07)
Basophils Absolute: 0 10*3/uL (ref 0.0–0.1)
Basophils Relative: 0 %
Eosinophils Absolute: 0.1 10*3/uL (ref 0.0–0.5)
Eosinophils Relative: 1 %
HCT: 42 % (ref 39.0–52.0)
Hemoglobin: 13.4 g/dL (ref 13.0–17.0)
Immature Granulocytes: 1 %
Lymphocytes Relative: 14 %
Lymphs Abs: 1.8 10*3/uL (ref 0.7–4.0)
MCH: 30.6 pg (ref 26.0–34.0)
MCHC: 31.9 g/dL (ref 30.0–36.0)
MCV: 95.9 fL (ref 80.0–100.0)
Monocytes Absolute: 0.5 10*3/uL (ref 0.1–1.0)
Monocytes Relative: 4 %
Neutro Abs: 10.8 10*3/uL — ABNORMAL HIGH (ref 1.7–7.7)
Neutrophils Relative %: 80 %
Platelets: 185 10*3/uL (ref 150–400)
RBC: 4.38 MIL/uL (ref 4.22–5.81)
RDW: 12.1 % (ref 11.5–15.5)
WBC: 13.4 10*3/uL — ABNORMAL HIGH (ref 4.0–10.5)
nRBC: 0 % (ref 0.0–0.2)

## 2021-03-16 LAB — I-STAT VENOUS BLOOD GAS, ED
Acid-Base Excess: 6 mmol/L — ABNORMAL HIGH (ref 0.0–2.0)
Bicarbonate: 33.8 mmol/L — ABNORMAL HIGH (ref 20.0–28.0)
Calcium, Ion: 1.22 mmol/L (ref 1.15–1.40)
HCT: 42 % (ref 39.0–52.0)
Hemoglobin: 14.3 g/dL (ref 13.0–17.0)
O2 Saturation: 81 %
Potassium: 4.3 mmol/L (ref 3.5–5.1)
Sodium: 138 mmol/L (ref 135–145)
TCO2: 36 mmol/L — ABNORMAL HIGH (ref 22–32)
pCO2, Ven: 59.3 mmHg (ref 44.0–60.0)
pH, Ven: 7.364 (ref 7.250–7.430)
pO2, Ven: 49 mmHg — ABNORMAL HIGH (ref 32.0–45.0)

## 2021-03-16 LAB — TROPONIN I (HIGH SENSITIVITY)
Troponin I (High Sensitivity): 7 ng/L (ref <18)
Troponin I (High Sensitivity): 5 ng/L (ref <18)

## 2021-03-16 MED ORDER — SODIUM CHLORIDE 0.9 % IV SOLN: INTRAVENOUS | Status: DC

## 2021-03-16 MED ORDER — HYDROXYZINE HCL 10 MG PO TABS
10.0000 mg | ORAL_TABLET | Freq: Once | ORAL | Status: AC
  Administered 2021-03-16: 10 mg via ORAL
  Filled 2021-03-16: qty 1

## 2021-03-16 MED ORDER — SODIUM CHLORIDE 0.9 % IV BOLUS
500.0000 mL | Freq: Once | INTRAVENOUS | Status: AC
  Administered 2021-03-16: 500 mL via INTRAVENOUS

## 2021-03-16 MED ORDER — IOHEXOL 300 MG/ML  SOLN
75.0000 mL | Freq: Once | INTRAMUSCULAR | Status: AC | PRN
  Administered 2021-03-16: 75 mL via INTRAVENOUS

## 2021-03-16 NOTE — Discharge Instructions (Addendum)
Up for the shortness of breath without any acute findings.  No evidence of any upper airway obstruction.  Swelling to the uvula has resolved.  Continue the steroids.  Follow-up with his doctors.  No evidence of any significant wheezing or poor air movement.  Oxygen saturations here are good on oxygen.  He does have severe COPD so some of his shortness of breath may be related to that.  But no intervention required.

## 2021-03-16 NOTE — ED Provider Notes (Signed)
[facility] EMERGENCY DEPARTMENT Provider Note   CSN: [id number] Arrival date & time: 03/16/21  0945     History Chief Complaint  Patient presents with  . Shortness of Breath    Edward Matthews is a 80 y.o. male.  Patient brought in by POV his son is accompanied him.  Patient with complaint of shortness of breath.  Patient known to have COPD.  And dementia.  And some hypothyroidism.  Patient normally on 3 to 4 L of oxygen.  Here on 2 L of oxygen oxygen saturations are good.  Patient was recently admitted March 17 through March 19 for angioedema.  He was noted to have on soft tissue CT of the neck some uvula swelling.  Patient discharged home on steroids.  And follow-up with his doctors.  Talking in complete sentences here.  Keeps wanting his oxygen turned up.  But he is at 100%.        Past Medical History:  Diagnosis Date  . COPD (chronic obstructive pulmonary disease) ([place])   . Dementia with behavioral disturbance ([place]) 03/02/2021  . GERD (gastroesophageal reflux disease)   . Hypothyroidism     Patient Active Problem List   Diagnosis Date Noted  . Uvulitis 03/12/2021  . Renal insufficiency 03/12/2021  . Leukocytosis 03/12/2021  . COPD with acute exacerbation ([place]) 03/02/2021  . Dementia with behavioral disturbance ([place]) 03/02/2021  . Hypothyroidism (acquired) 03/02/2021  . 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 Paranoia ([place])   . Tobacco abuse   . Chest pain 01/17/2018  . COPD (chronic obstructive pulmonary disease) ([place]) 01/17/2018  . Chronic respiratory failure with hypoxia ([place]) 01/17/2018    Past Surgical History:  Procedure Laterality Date  . COLONOSCOPY WITH PROPOFOL N/A 07/28/2017   Procedure: COLONOSCOPY WITH PROPOFOL;  Surgeon: Carol Ada, MD;  Location: [facility];  Service: Endoscopy;  Laterality: N/A;  . ESOPHAGOGASTRODUODENOSCOPY (EGD) WITH PROPOFOL N/A 07/28/2017   Procedure: ESOPHAGOGASTRODUODENOSCOPY (EGD) WITH PROPOFOL;   Surgeon: Carol Ada, MD;  Location: [facility];  Service: Endoscopy;  Laterality: N/A;  . TONSILLECTOMY         Family History  Problem Relation Age of Onset  . Hypertension Father     Social History   Tobacco Use  . Smoking status: Current Every Day Smoker    Packs/day: 1.50    Years: 63.00    Pack years: 94.50    Types: Cigarettes  . Smokeless tobacco: Never Used  Substance Use Topics  . Alcohol use: Yes    Comment: occasional  . Drug use: Not Currently    Home Medications Prior to Admission medications   Medication Sig Start Date End Date Taking? Authorizing Provider  ADVAIR DISKUS 250-50 MCG/DOSE AEPB Inhale 2 puffs into the lungs 2 (two) times daily.  05/30/17   [provider]  albuterol (VENTOLIN HFA) 108 (90 Base) MCG/ACT inhaler Inhale 1 puff into the lungs every 4 (four) hours as needed for wheezing or shortness of breath.    [provider]  aspirin 81 MG chewable tablet Chew 81 mg by mouth daily. 08/15/18   [provider]  dexlansoprazole (DEXILANT) 60 MG capsule Take 60 mg by mouth daily as needed (acid reflux).    [provider]  fluticasone (FLONASE) 50 MCG/ACT nasal spray Place 1 spray into both nostrils daily. 03/13/21   Barb Merino, MD  guaiFENesin (MUCINEX) 600 MG 12 hr tablet Take 1 tablet (600 mg total) by mouth 2 (two) times daily as needed for  cough or to loosen phlegm. 03/03/21   Aline August, MD  hydrOXYzine (ATARAX/VISTARIL) 10 MG tablet Take 1 tablet (10 mg total) by mouth 3 (three) times daily as needed for anxiety. 03/13/21   Barb Merino, MD  levothyroxine (SYNTHROID) 125 MCG tablet Take 1 tablet (125 mcg total) by mouth daily before breakfast. 03/13/21 04/12/21  Barb Merino, MD  predniSONE (DELTASONE) 10 MG tablet 4 tabs for 2 days, 3 tabs for 2 days, 2 tabs for 2 days, 1 tabs for 2 days 03/13/21   Barb Merino, MD  traZODone (DESYREL) 100 MG tablet Take 1 tablet (100 mg total) by mouth at bedtime.  03/13/21 04/12/21  Barb Merino, MD    Allergies    Chantix [varenicline]  Review of Systems   Review of Systems  Constitutional: Negative for chills and fever.  HENT: Negative for rhinorrhea and sore throat.   Eyes: Negative for visual disturbance.  Respiratory: Positive for shortness of breath. Negative for cough.   Cardiovascular: Negative for chest pain and leg swelling.  Gastrointestinal: Negative for abdominal pain, diarrhea, nausea and vomiting.  Genitourinary: Negative for dysuria.  Musculoskeletal: Negative for back pain and neck pain.  Skin: Negative for rash.  Neurological: Negative for dizziness, light-headedness and headaches.  Hematological: Does not bruise/bleed easily.  Psychiatric/Behavioral: Negative for confusion.    Physical Exam Updated Vital Signs BP 135/76   Pulse (!) 55   Temp 98 F (36.7 C) (Oral)   Resp (!) 26   SpO2 100%   Physical Exam Vitals and nursing note reviewed.  Constitutional:      Appearance: Normal appearance. He is well-developed.  HENT:     Head: Normocephalic and atraumatic.  Eyes:     Conjunctiva/sclera: Conjunctivae normal.     Pupils: Pupils are equal, round, and reactive to light.  Cardiovascular:     Rate and Rhythm: Normal rate and regular rhythm.     Heart sounds: No murmur heard.   Pulmonary:     Effort: Pulmonary effort is normal. No respiratory distress.     Breath sounds: Normal breath sounds. No wheezing, rhonchi or rales.  Abdominal:     Palpations: Abdomen is soft.     Tenderness: There is no abdominal tenderness.  Musculoskeletal:        General: No swelling.     Cervical back: Neck supple.  Skin:    General: Skin is warm and dry.     Capillary Refill: Capillary refill takes less than 2 seconds.  Neurological:     General: No focal deficit present.     Mental Status: He is alert and oriented to person, place, and time.     Cranial Nerves: No cranial nerve deficit.     Sensory: No sensory deficit.      Motor: No weakness.     ED Results / Procedures / Treatments   Labs (all labs ordered are listed, but only abnormal results are displayed) Labs Reviewed  CBC WITH DIFFERENTIAL/PLATELET - Abnormal; Notable for the following components:      Result Value   WBC 13.4 (*)    Neutro Abs 10.8 (*)    Abs Immature Granulocytes 0.13 (*)    All other components within normal limits  COMPREHENSIVE METABOLIC PANEL - Abnormal; Notable for the following components:   CO2 33 (*)    Calcium 8.6 (*)    Total Protein 5.7 (*)    Albumin 3.2 (*)    AST 12 (*)    Anion gap 4 (*)  All other components within normal limits  TROPONIN I (HIGH SENSITIVITY)  TROPONIN I (HIGH SENSITIVITY)    EKG EKG Interpretation  Date/Time:  Tuesday March 16 2021 10:05:21 EDT Ventricular Rate:  52 PR Interval:  198 QRS Duration: 92 QT Interval:  416 QTC Calculation: 386 R Axis:   69 Text Interpretation: Sinus bradycardia Otherwise normal ECG Interpretation limited secondary to artifact Confirmed by Fredia Sorrow [phone]) on 03/16/2021 11:43:45 AM   Radiology DG Chest 2 View  Result Date: 03/16/2021 CLINICAL DATA:  Shortness of, cough last night, weakness, history COPD EXAM: CHEST - 2 VIEW COMPARISON:  03/11/2021 FINDINGS: Normal heart size and mediastinal contours. Prominent central pulmonary arteries question pulmonary arterial hypertension. Atherosclerotic calcification aorta. Emphysematous and bronchitic changes consistent with COPD. Accentuation of interstitial markings at the lung bases likely increased due to a mild degree of hypoinflation. No segmental consolidation, pleural effusion, or pneumothorax. Bones demineralized with scattered degenerative disc disease changes thoracic spine. IMPRESSION: COPD changes with question pulmonary arterial hypertension. No acute abnormalities. Electronically Signed   By: Lavonia Dana M.D.   On: 03/16/2021 10:59    Procedures Procedures   Medications Ordered in  ED Medications - No data to display  ED Course  I have reviewed the triage vital signs and the nursing notes.  Pertinent labs & imaging results that were available during my care of the patient were reviewed by me and considered in my medical decision making (see chart for details).    MDM Rules/Calculators/A&P                          Patient with a history of significant COPD.  Patient currently on steroids after his last admission.  Chest x-ray shows COPD changes.  No acute abnormalities.  Lungs clear here no wheezing.  Patient's oxygen levels are good.  Patient kept feeling as if air was not moving in his upper airway area.  So CT soft tissue neck was repeated.  No evidence of any swelling at all.  I think this is just a feeling the patient has it may be secondary to his advanced COPD.  But no evidence of any significant airway problem.  Patient does not meet criteria for admission can be discharged home.  Labs significant for mild leukocytosis.  Troponins are not elevated.  Complete metabolic panel shows CO2 of 33 which goes along with his COPD.  Patient mentating well.  No evidence of any hypercapnia clinically.   Patient stable for discharge home     Final Clinical Impression(s) / ED Diagnoses Final diagnoses:  None    Rx / DC Orders ED Discharge Orders    None       Fredia Sorrow, MD 03/16/21 1622

## 2021-03-16 NOTE — ED Triage Notes (Signed)
Arrived POV; c/o worsening shortness of breath,

## 2021-03-19 ENCOUNTER — Telehealth: Payer: Self-pay

## 2021-03-19 NOTE — Telephone Encounter (Signed)
1235PM: Palliative care SW outreached patient for telephonic visit.  SW left HIPPA complaint VM. Awaiting return call.  Will continue to offer palliative care support.

## 2021-03-23 ENCOUNTER — Other Ambulatory Visit: Payer: PPO

## 2021-03-23 ENCOUNTER — Other Ambulatory Visit: Payer: Self-pay

## 2021-03-23 VITALS — BP 100/72 | HR 77 | Temp 98.4°F | Resp 28

## 2021-03-23 DIAGNOSIS — Z515 Encounter for palliative care: Secondary | ICD-10-CM

## 2021-03-23 NOTE — Progress Notes (Signed)
PATIENT NAME: Edward Matthews DOB: April 12, 1941 MRN: [id number]  PRIMARY CARE PROVIDER: [name], MD  RESPONSIBLE PARTY:  Acct ID - Guarantor Home Phone Work Phone Relationship Acct Type  [id number] [name],* [phone]  Self P/F     [address], [place], [place] [address]    PLAN OF CARE and INTERVENTIONS:               1.  GOALS OF CARE/ ADVANCE CARE PLANNING:  Patient desires to remain home with the assistance of his son.               2.  PATIENT/CAREGIVER EDUCATION:  COPD, Anxiety               4. PERSONAL EMERGENCY PLAN:  Activate 911 for emergencies.               5.  DISEASE STATUS:  Joint visit with [place], SW, son [name] and patient.  Patient had been in bed for the last 3 days.  He is now up and ambulating.  Denies any falls and not currently using any assistive devices for ambulation.   Patient has severe COPD and becomes short of breath while talking.  De-sating to 77% while ambulating 15-20 steps with O2 at 3 1/2 L of O2.  Denies cough.  Patient has not been taking mucinex but recently restarted this.  Discussed checking O2 sats periodically and risks of hypercapnia if O2 is used incorrectly.  Patient believes the ambulance came down the street and that it is coming for him. He believes his son is moving items in the home to make him more confused.  He believes his son is turning his oxygen down to a lower level.  Son confirms he is ensuring O2 is remaining at 3 to 3 1/2 Liters but he denies turning it below this.  Patient is very suspicious of son and believes he is making incorrect statements.  Patient reports issues with constipation in the last couple of days.  He is taking a stool softner as needed.  Encouraged him to take this daily if needed.  Patient reports occasional issues with nausea but denies vomiting.   Patient with significant anxiety during this visit. Anxiety is easily triggered with conversations with his son.  Patient has atarax 10 mg but son  notes there is not change in patient's anxiety when he takes this.  After discharge from [facility], patient was on Zyprexa 10 mg po HS, Aricept 5 mg po hs and Trazodone 50 mg po HS.  Patient is no longer taking zyprexa or aricept.  Patient states he did not have refills on either of these medications but son reports patient stopped taking them and they no longer have the medication bottles.  Patient reports generalized pain mostly to his knees and back.  He is taking Ibuprofen but does not feel this is beneficial.  Patient will follow up with PCP this Thursday.  Encouraged patient and son to notify PCP of need for refills, worsening anxiety and ongoing pain that is not relieved with ibuprofen.  Follow up visit scheduled with patient in 2 weeks.    HISTORY OF PRESENT ILLNESS:  80 year old male with COPD.  Patient is being followed by Palliative Care monthly and PRN.  CODE STATUS: Full ADVANCED DIRECTIVES: No MOST FORM: No PPS: 50%   PHYSICAL EXAM:   VITALS: Today's Vitals   03/23/21 0904  BP: 100/72  Pulse: 77  Resp: Marland Kitchen)  28  Temp: 98.4 F (36.9 C)  SpO2: 94%  PainSc: 4     LUNGS: right lower lobe diminished, + DOE CARDIAC: HRR EXTREMITIES: - for edema SKIN: Warm and dry to touch.  No skin breakdown reported. NEURO:  Alert and oriented to  + for hallucinations and paranoia       Lorenza Burton, RN

## 2021-03-23 NOTE — Progress Notes (Signed)
COMMUNITY PALLIATIVE CARE SW NOTE  PATIENT NAME: Edward Matthews DOB: March 03, 1941 MRN: [id number]  PRIMARY CARE PROVIDER: [name], MD  RESPONSIBLE PARTY:  Acct ID - Guarantor Home Phone Work Phone Relationship Acct Type  [id number] [name],* [phone]  Self P/F     [address] [place], [place] [address]     PLAN OF CARE and INTERVENTIONS:             1. GOALS OF CARE/ ADVANCE CARE PLANNING:  Full code. Goal is to remain in the home.  2. SOCIAL/EMOTIONAL/SPIRITUAL ASSESSMENT/ INTERVENTIONS:  SW  and RN made follow up visit to patient home. Patients son, Edward Matthews, was present as well.    Son [name] SW on yesterday 03/22/21 and shared that patient was not having a good day and was very weak as well as being very paranoid. Son inquired about guidance on what todo, due to son feeling as though he could not provide the care/support patient needed and patient appearing to have declined over the past few days SW advised son to call EMS to have patient transported to the hospital, if son feels as though patient has declined beyond a point where son ca assist. Son shared that patient needed assistance with ADL's that he typically was able to do at baseline, like toileting, bathing and dressing Son had declined to outreach EMS at that time and wait on PC team to visit.                                                                              At time of visit today patient was sitting in living room with O2 on 3.5L. Patient was in apparent anxious mode and possible respiratory distress, which causes his heighten anxiousness. Patient anxious and paranoid about son Edward Matthews and what he does when gets up and moves about the home. Patient presents with signs of paranoia and disorganized thinking. Patient consistently rebutes against comments that son makes during the visit.  Patient has had a number of hospital visits this month due to complaints of SHOB. Patient states that he fears the  hospital will try to "put him in a home". Son shared that since patients last hospital visit patient had been in bed and appearing weaker over the past three weeks. However, today is a better day for patient, and he was ale to get up, get dressed and ambulate around the home with out assistance or AD.   RN checked vital signs and reviewed medications with patient and son. Patient is currently taking ibuprofen, hydroxyzine, synthroid, trazadone and albuterol with asthma pumps.  Patient is no longer taking Aricept or Zyprexa. Son feels that these medications were helping patients paranoid mind state, son also feels that hydroxyzine is not effective for patients anxiety nor the trazadone.   Patient has upcoming PCP appointment with Dr. Theda Sers Thu 3/31 @1pm . PC team encouraged patient and son to discuss medication regimen and possible pulmonary consult. Patient can also benefit from rescheduling neuro appointment and psychiatry consult.   SW discussed patients long term care and assistance in the home. Patient acknowledges that he needs assistance in the home. Patient states that although his sin is there, he fears  his son will leave and go back home not come back (a paranoid thought process for patient), although son has stated that he is willing to stay as long as patient needs but that he does need to be able to go to his own doctors appointments and check on is home. Patient appears to fear staying in his alone. SW briefly touched on acquiring in home support for patient. This topic made patient a bit anxious, SW stabled topic at this time.  Palliative care will continue to follow.   3.  PATIENT/CAREGIVER EDUCATION/ COPING:  Patient A&O x3 this visit. With some delusional thought processes. Son, Edward Matthews, present and assist   4.             PERSONAL EMERGENCY PLAN:  Patient will call 911 for emergencies  5. COMMUNITY RESOURCES COORDINATION/ HEALTH CARE NAVIGATION: Patient and sons manages care.  6.                  FINANCIAL/LEGAL CONCERNS/INTERVENTIONS:  None     SOCIAL HX:  Social History   Tobacco Use  . Smoking status: Current Every Day Smoker    Packs/day: 1.50    Years: 63.00    Pack years: 94.50    Types: Cigarettes  . Smokeless tobacco: Never Used  Substance Use Topics  . Alcohol use: Yes    Comment: occasional    CODE STATUS: Full code ADVANCED DIRECTIVES: N MOST FORM COMPLETE:  N HOSPICE EDUCATION PROVIDED: N  PPS: Patient is independent with dressing and toileting. Patient is MINA with showering.  Patient no longer drives.  Time spent: 1hr 48min      [place], [place]

## 2021-03-25 DIAGNOSIS — Z9981 Dependence on supplemental oxygen: Secondary | ICD-10-CM | POA: Diagnosis not present

## 2021-03-25 DIAGNOSIS — J449 Chronic obstructive pulmonary disease, unspecified: Secondary | ICD-10-CM | POA: Diagnosis not present

## 2021-03-25 DIAGNOSIS — F0391 Unspecified dementia with behavioral disturbance: Secondary | ICD-10-CM | POA: Diagnosis not present

## 2021-03-25 DIAGNOSIS — K219 Gastro-esophageal reflux disease without esophagitis: Secondary | ICD-10-CM | POA: Diagnosis not present

## 2021-03-27 DIAGNOSIS — J449 Chronic obstructive pulmonary disease, unspecified: Secondary | ICD-10-CM | POA: Diagnosis not present

## 2021-03-30 ENCOUNTER — Telehealth: Payer: Self-pay

## 2021-03-30 NOTE — Telephone Encounter (Signed)
03/30/21 @ 11AM: Palliative care SW received return phone call from [place] at PCP office. Debbie shared that patient was seen by Dr. Theda Sers on 03/25/21. Patient was started back on Aricept, Olanzapine and Buspirone. No new referrals made at this.

## 2021-03-30 NOTE — Telephone Encounter (Signed)
(  6:43p) SW returned call to patient's son-Jimmy. He had concerns regarding pending legal actions taken by his other brothers. SW advised him that he has a primary Passenger transport manager that is working with them. SW provided active listening and reassurance of support he and his father, but felt it would be best if his primary team follow-up with him.  SW requested follow-up from primary LCSWA-A. Henrene Pastor to follow-up.

## 2021-04-03 NOTE — ED Provider Notes (Signed)
[facility] Provider Note   CSN: [id number] Arrival date & time: 03/02/21  6314     History Chief Complaint  Patient presents with  . Shortness of Breath    Edward Matthews is a 80 y.o. male.   Shortness of Breath Severity:  Mild Onset quality:  Gradual Timing:  Constant Progression:  Worsening Chronicity:  Recurrent Context: not activity   Relieved by:  Nothing Worsened by:  Nothing Ineffective treatments:  None tried Associated symptoms: cough and sputum production   Associated symptoms: no abdominal pain, no chest pain and no fever        Past Medical History:  Diagnosis Date  . COPD (chronic obstructive pulmonary disease) ([place])   . Dementia with behavioral disturbance ([place]) 03/02/2021  . GERD (gastroesophageal reflux disease)   . Hypothyroidism     Patient Active Problem List   Diagnosis Date Noted  . Uvulitis 03/12/2021  . Renal insufficiency 03/12/2021  . Leukocytosis 03/12/2021  . COPD with acute exacerbation ([place]) 03/02/2021  . Dementia with behavioral disturbance ([place]) 03/02/2021  . Hypothyroidism (acquired) 03/02/2021  . Delusional disorder ([place]) 11/18/2020  . Paranoia ([place])   . Tobacco abuse   . Chest pain 01/17/2018  . COPD (chronic obstructive pulmonary disease) ([place]) 01/17/2018  . Chronic respiratory failure with hypoxia ([place]) 01/17/2018    Past Surgical History:  Procedure Laterality Date  . COLONOSCOPY WITH PROPOFOL N/A 07/28/2017   Procedure: COLONOSCOPY WITH PROPOFOL;  Surgeon: Carol Ada, MD;  Location: [facility];  Service: Endoscopy;  Laterality: N/A;  . ESOPHAGOGASTRODUODENOSCOPY (EGD) WITH PROPOFOL N/A 07/28/2017   Procedure: ESOPHAGOGASTRODUODENOSCOPY (EGD) WITH PROPOFOL;  Surgeon: Carol Ada, MD;  Location: [facility];  Service: Endoscopy;  Laterality: N/A;  . TONSILLECTOMY         Family History  Problem Relation Age of Onset  . Hypertension Father     Social History   Tobacco Use  .  Smoking status: Current Every Day Smoker    Packs/day: 1.50    Years: 63.00    Pack years: 94.50    Types: Cigarettes  . Smokeless tobacco: Never Used  Substance Use Topics  . Alcohol use: Yes    Comment: occasional  . Drug use: Not Currently    Home Medications Prior to Admission medications   Medication Sig Start Date End Date Taking? Authorizing Provider  ADVAIR DISKUS 250-50 MCG/DOSE AEPB Inhale 2 puffs into the lungs 2 (two) times daily.  05/30/17  Yes [provider]  albuterol (VENTOLIN HFA) 108 (90 Base) MCG/ACT inhaler Inhale 1 puff into the lungs every 4 (four) hours as needed for wheezing or shortness of breath.   Yes [provider]  aspirin 81 MG chewable tablet Chew 81 mg by mouth daily. 08/15/18  Yes [provider]  dexlansoprazole (DEXILANT) 60 MG capsule Take 60 mg by mouth daily as needed (acid reflux).    [provider]  fluticasone (FLONASE) 50 MCG/ACT nasal spray Place 1 spray into both nostrils daily. 03/13/21   Barb Merino, MD  guaiFENesin (MUCINEX) 600 MG 12 hr tablet Take 1 tablet (600 mg total) by mouth 2 (two) times daily as needed for cough or to loosen phlegm. 03/03/21   Aline August, MD  hydrOXYzine (ATARAX/VISTARIL) 10 MG tablet Take 1 tablet (10 mg total) by mouth 3 (three) times daily as needed for anxiety. 03/13/21   Barb Merino, MD  levothyroxine (SYNTHROID) 125 MCG tablet Take 1 tablet (125 mcg total) by mouth daily  before breakfast. 03/13/21 04/12/21  Barb Merino, MD  predniSONE (DELTASONE) 10 MG tablet 4 tabs for 2 days, 3 tabs for 2 days, 2 tabs for 2 days, 1 tabs for 2 days 03/13/21   Barb Merino, MD  traZODone (DESYREL) 100 MG tablet Take 1 tablet (100 mg total) by mouth at bedtime. 03/13/21 04/12/21  Barb Merino, MD    Allergies    Chantix [varenicline]  Review of Systems   Review of Systems  Constitutional: Negative for fever.  Respiratory: Positive for cough, sputum production and shortness of  breath.   Cardiovascular: Negative for chest pain.  Gastrointestinal: Negative for abdominal pain.  All other systems reviewed and are negative.   Physical Exam Updated Vital Signs BP 117/63 (BP Location: Left Arm)   Pulse 73   Temp 97.7 F (36.5 C) (Oral)   Resp 16   Ht 5\' 7"  (1.702 m)   Wt 62.9 kg   SpO2 93%   BMI 21.72 kg/m   Physical Exam Vitals and nursing note reviewed.  Constitutional:      Appearance: He is well-developed.  HENT:     Head: Normocephalic and atraumatic.     Nose: Nose normal. No congestion or rhinorrhea.     Mouth/Throat:     Mouth: Mucous membranes are moist.  Eyes:     Pupils: Pupils are equal, round, and reactive to light.  Cardiovascular:     Rate and Rhythm: Normal rate.  Pulmonary:     Effort: Pulmonary effort is normal. No respiratory distress.  Abdominal:     General: There is no distension.  Musculoskeletal:        General: Normal range of motion.     Cervical back: Normal range of motion.  Skin:    General: Skin is warm and dry.  Neurological:     General: No focal deficit present.     Mental Status: He is alert and oriented to person, place, and time.     Cranial Nerves: No cranial nerve deficit.     ED Results / Procedures / Treatments   Labs (all labs ordered are listed, but only abnormal results are displayed) Labs Reviewed  BASIC METABOLIC PANEL - Abnormal; Notable for the following components:      Result Value   Glucose, Bld 103 (*)    BUN 24 (*)    All other components within normal limits  TSH - Abnormal; Notable for the following components:   TSH 0.017 (*)    All other components within normal limits  BASIC METABOLIC PANEL - Abnormal; Notable for the following components:   Potassium 3.4 (*)    Glucose, Bld 102 (*)    BUN 28 (*)    Calcium 8.7 (*)    All other components within normal limits  CBC - Abnormal; Notable for the following components:   WBC 12.7 (*)    RBC 3.68 (*)    Hemoglobin 11.8 (*)     HCT 34.0 (*)    All other components within normal limits  I-STAT VENOUS BLOOD GAS, ED - Abnormal; Notable for the following components:   pH, Ven 7.488 (*)    pCO2, Ven 36.2 (*)    Acid-Base Excess 4.0 (*)    Calcium, Ion 1.12 (*)    HCT 38.0 (*)    Hemoglobin 12.9 (*)    All other components within normal limits  RESP PANEL BY RT-PCR (FLU A&B, COVID) ARPGX2  CBC    EKG EKG Interpretation  Date/Time:  Tuesday March 02 2021 06:19:23 EST Ventricular Rate:  59 PR Interval:  190 QRS Duration: 86 QT Interval:  396 QTC Calculation: 392 R Axis:   74 Text Interpretation: Sinus bradycardia artifact, otherwise no sig change since last tracing Confirmed by Charlesetta Shanks [phone]) on 03/03/2021 4:09:21 PM   Radiology No results found.  Procedures Procedures   Medications Ordered in ED Medications  lactated ringers infusion (0 mLs Intravenous Stopped 03/02/21 2119)  COVID-19 mRNA vaccine (Moderna) injection 0.25 mL (0.25 mLs Intramuscular Not Given 03/03/21 1600)  ipratropium (ATROVENT HFA) inhaler 2 puff (2 puffs Inhalation Given 03/02/21 0751)  methylPREDNISolone sodium succinate (SOLU-MEDROL) 125 mg/2 mL injection 125 mg (125 mg Intravenous Given 03/02/21 0751)  azithromycin (ZITHROMAX) 500 mg in sodium chloride 0.9 % 250 mL IVPB (0 mg Intravenous Stopped 03/02/21 1219)  methylPREDNISolone sodium succinate (SOLU-MEDROL) 125 mg/2 mL injection 60 mg (60 mg Intravenous Given 03/03/21 1039)  COVID-19 mRNA vaccine (Moderna) injection 0.25 mL (0.25 mLs Intramuscular Given 03/03/21 1736)    ED Course  I have reviewed the triage vital signs and the nursing notes.  Pertinent labs & imaging results that were available during my care of the patient were reviewed by me and considered in my medical decision making (see chart for details).    MDM Rules/Calculators/A&P                          Here with likely copd exacerbation. Treatments provided in ED. Pending labs and reevaluation for disposition at  time of transfer of care.   Final Clinical Impression(s) / ED Diagnoses Final diagnoses:  Shortness of breath  COPD exacerbation ([place])    Rx / DC Orders ED Discharge Orders         Ordered    predniSONE (DELTASONE) 20 MG tablet  Daily with breakfast,   Status:  Discontinued        03/03/21 1001    guaiFENesin (MUCINEX) 600 MG 12 hr tablet  2 times daily PRN        03/03/21 1001    Increase activity slowly        03/03/21 1001    Diet - low sodium heart healthy        03/03/21 1001    Amb Referral to Palliative Care       Comments: Goals of care   03/03/21 1001    Ambulatory referral to Pulmonology       Comments: Recently hospitalized for COPD exacerbation   03/03/21 1001           Cory Kitt, [name], MD 04/03/21 0715

## 2021-04-06 ENCOUNTER — Other Ambulatory Visit: Payer: PPO

## 2021-04-06 ENCOUNTER — Other Ambulatory Visit: Payer: Self-pay

## 2021-04-06 VITALS — BP 154/80 | HR 65 | Temp 98.1°F | Resp 24

## 2021-04-06 DIAGNOSIS — Z515 Encounter for palliative care: Secondary | ICD-10-CM

## 2021-04-06 NOTE — Progress Notes (Signed)
PATIENT NAME: Edward Matthews DOB: [birth year] MRN: [id number]  PRIMARY CARE PROVIDER: [name], MD  RESPONSIBLE PARTY:  Acct ID - Guarantor Home Phone Work Phone Relationship Acct Type  [id number] Edward Matthews,* [phone]  Self P/F     [address], [place], [place] [address]    PLAN OF CARE and INTERVENTIONS:               1.  GOALS OF CARE/ ADVANCE CARE PLANNING:  Patient desires to remain in the home and independent with the assistance of his son.               2.  PATIENT/CAREGIVER EDUCATION:  Trazodone and Buspar               4. PERSONAL EMERGENCY PLAN:  Activate 911 for emergencies.               5.  DISEASE STATUS:  Joint visit with Edward Matthews, SW, Edward Matthews-son and patient.  Patient is found in the living room with O2 in place @ 3 L via Edward Matthews.   Patient is engaging in conversation and reports he has been doing well over all.  He reports not sleeping well during the night and believes he only had 15 minutes of rest.  Patient is not taking 150 mg of Trazodone for sleep at this time.  He has 2 more pills of 100 mg Trazodone his son is trying to finish before he begins taking 150 mg nightly.  Education provided on Trazodone for sleep and Buspar for anxiety.  Son Edward Matthews is now setting up a pill box weekly for patient.   Patient is compliant with current medications.   Patient continues with shortness of breath with exertion.  He does have a productive cough with clear sputum.  Patient is taking mucinex as needed.  O2 is remaining in place at 2-3 L via Edward Matthews.   Patient and son are completing meal prep daily.  Patient notes his appetite is very good.  He is eating 3 meals a day and has occasional snacks.  Patient is now able to complete personal care and is showering, shaving and cutting his own hair.  He denies any issues with ADL's.  Denies any falls and does not use assistive devices with ambulation.   Patient recalls his time at [facility].  He begins with delusion comments  regarding a time at his PCP office where 60-70 people were watching as the police escorted him away with a gun. Patient is easily re-directed.    HISTORY OF PRESENT ILLNESS:  80 year old male with COPD.  Patient is being followed by Palliative Care monthly and PRN.  CODE STATUS: Full ADVANCED DIRECTIVES: No MOST FORM: No PPS: 50%   PHYSICAL EXAM:   VITALS: Today's Vitals   04/06/21 0923  BP: (!) 154/80  Pulse: 65  Resp: (!) 24  Temp: 98.1 F (36.7 C)  SpO2: 96%  PainSc: 0-No pain    LUNGS: CTA, + DOE, clear sputum production CARDIAC: HRR EXTREMITIES: - for edema SKIN: warm and dry to touch.  New abrasion to the right upper arm. NEURO: alert and oriented x 3       Edward Burton, RN

## 2021-04-06 NOTE — Progress Notes (Signed)
[facility] SW NOTE  PATIENT NAME: Edward Matthews DOB: August 27, 1941 MRN: [id number]  PRIMARY CARE PROVIDER: [name], MD  RESPONSIBLE PARTY:  Acct ID - Guarantor Home Phone Work Phone Relationship Acct Type  [id number] Edward Matthews,* [phone]  Self P/F     [address], [place], [place] [address]     PLAN OF CARE and INTERVENTIONS:             1. GOALS OF CARE/ ADVANCE CARE PLANNING:  Patient is a DNR. Status changed at recent PCP visit on 03/25/21. Goal is to remain in the home.   2.         SOCIAL/EMOTIONAL/SPIRITUAL ASSESSMENT/ INTERVENTIONS:  SW and RN made follow up visit to patient home. Patient's son, Edward Matthews, was present as well.   Patient sitting in living room with O2 on 3.5L. Patient appeared to be in better mood and spirits this visit. No evidence of anxiousness or possible respiratory distress. Patient's paranoia and disorganized thinking has improved since last visit. Patient is more oriented this visit. Patient shared that he did not sleep well lastnight, maybe 19mins. 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 Patient takes trazadone for sleep. Patient appetite is good and is able to make his own meals.   Patient has improved physically, he states that he is able to a complete his own ADL's with out assistance. Patient shared that he bathed, dressed and cut his own hair today. Son, Edward Matthews, agreed with patients improvement. Patient shared that his deceased wife's niece shared that she will provide in home support for patient when he needs it. Patient stated that he knows he will need assistance at some point, but feels he okay for now. Patients son, Edward Matthews, is feeling more incline to take time for himself to schedule appointments and go to his home.   Patient had follow up PCP appointment 03/25/21. PCP refilled Aricept, busporione and Zyprexa. No new referral made from appointment. PC discussed  neurology consult and follow up for hx of CVA's and dementia. Patient shared that he is open to this.    RN checked vital signs and reviewed medications with patient and son. Patients son has set up pill box with all current medications. PC provided updated medication list. Patient is compliant with medications and is more cognizant of what he is taking as well. Patient is currently taking ibuprofen, hydroxyzine, synthroid, trazadone and albuterol with asthma pumps, Aricept, buspirone and Zyprexa. Trazadone was increased to 150mg . Patient can also benefit from rescheduling neuro appointment and psychiatry consult.   Patient is doing well overall and has improved since last visit. Patient is compliant with medications and O2. Palliative care will continue to follow.    3.         PATIENT/CAREGIVER EDUCATION/ COPING:  Patient A&O x3 this visit. Patients paranoia and anxiousness has decreased since last visit. Restarting psych medications has improved these behaviors.    4.             PERSONAL EMERGENCY PLAN:  Patient will call 911 for emergencies   5.         COMMUNITY RESOURCES COORDINATION/ HEALTH CARE NAVIGATION: Patient and sons manages care.   6.                 FINANCIAL/LEGAL CONCERNS/INTERVENTIONS:  None      SOCIAL HX:  Social History   Tobacco Use  .  Smoking status: Current Every Day Smoker    Packs/day: 1.50    Years: 63.00    Pack years: 94.50    Types: Cigarettes  . Smokeless tobacco: Never Used  Substance Use Topics  . Alcohol use: Yes    Comment: occasional    CODE STATUS: DNR  ADVANCED DIRECTIVES: N MOST FORM COMPLETE:  N HOSPICE EDUCATION PROVIDED: N  PPS: Patient is independent with all ADL's. Patient does not drive.   Time spent: 1hr.       Edward Eland, LCSW

## 2021-04-26 DIAGNOSIS — J449 Chronic obstructive pulmonary disease, unspecified: Secondary | ICD-10-CM | POA: Diagnosis not present

## 2021-05-04 ENCOUNTER — Other Ambulatory Visit: Payer: PPO

## 2021-05-04 ENCOUNTER — Other Ambulatory Visit: Payer: Self-pay

## 2021-05-04 NOTE — Progress Notes (Signed)
05/04/21 @8AM : Palliative care SW received communication that patients son, Mickle Mallory [facility] to cancel today's visit at [place], due to son having a work emergency and patient not feeling well.    @ 0750 ON CALL TELEPHONE: Thad Ranger, son, reports patient has a visit scheduled for 9A this morning. Visit needs to be rescheduled.    @ [phone] On call nurse returned call; spoke with [name], who reports [place] Henrene Pastor had reached out to him and scheduled a visit to admit to palliative care service, this morning at [place] reports the patient is not feeling well and he has been called to an emergency with his employer. John requests the visit be cancelled for this morning; however, he reports he will call [facility] to reschedule the visit as soon as he is able. This RN acknowledges and assures him the message will be given to [place]. [name] is very Patent attorney of the return call.

## 2021-05-04 NOTE — Progress Notes (Signed)
05/03/21 @330  PM: Palliative care SW outreached patients son, [name], to confirm scheduled in home visit for 5/10. Son shared that he is no longer staying at patients home, due to having to leave for his own medical issues. Son shared that his brother, [name], has been in the home with patient at night time. Patient is left alone during the day. Jimmy advised SW outreach his brother, [name], to confirm appointment as he thinks patient has another MD appointment tomorrow as well.  335PM: Palliative care SW outreached patients son, [name], to confirm visit for tomorrow 5/10. John shared that patient had a PCP f/u tomorrow at 1130 AM, however, patient called to cancel. John shared that he has been staying with patient at night as he works during the day. John advised that he would be present for [facility] visit at [place] did not have any concerns in regard to patient at this time other than patient seems to be in the bed more.   In home visit confirmed for 05/04/21 @9am .

## 2021-05-14 ENCOUNTER — Other Ambulatory Visit: Payer: PPO

## 2021-05-14 ENCOUNTER — Other Ambulatory Visit: Payer: Self-pay

## 2021-05-14 VITALS — BP 112/64 | Temp 98.2°F | Resp 24

## 2021-05-14 DIAGNOSIS — Z515 Encounter for palliative care: Secondary | ICD-10-CM

## 2021-05-14 NOTE — Progress Notes (Signed)
PATIENT NAME: Edward Matthews DOB: September 03, 1941 MRN: [id number]  PRIMARY CARE PROVIDER: [name], MD  RESPONSIBLE PARTY:  Acct ID - Guarantor Home Phone Work Phone Relationship Acct Type  [id number] Edward Matthews,* [phone]  Self P/F     [address] [place], [place] [address]    PLAN OF CARE and INTERVENTIONS:               1.  GOALS OF CARE/ ADVANCE CARE PLANNING:  Remain home under the care of his sons.               2.  PATIENT/CAREGIVER EDUCATION:  Hospice Care               4. PERSONAL EMERGENCY PLAN:  Activate 911 for emergencies.               5.  DISEASE STATUS:  Joint visit completed with Edward Matthews, SW, Edward Matthews-son and patient.  Patient is found lying in a supine position in the bed.  He is visibly short of breath with rapid, labored breathing.  Discussed sitting in a more upright position but patient states this does not help.  Patient has experienced a functional decline since my last visit.  He and his son report patient is remaining in the bed unless he needs to toilet.  Shortness of breath worsens with minimal exertion including talking.  Patient notes an increase in overall weakness.  He is eating well and does not feel there has been a significant weight loss.  Last weight obtained 1 week ago was 138 lbs. Edward Matthews is now completing sponge baths with patient as he does not have the endurance to shower himself any longer.  Son is also doing most of the meal preparations now.   Patient declines ED visit stating he wishes to remain home.  He will not accept additional services in the home as he is very skeptical of insurance coverage.  Reviewed medications with son and patient is compliant with current meds.   They continue to utilize a pill box weekly.   Spoke with son, Edward Matthews separately and advised that patient likely qualifies for hospice care but we would not be able to initiate this referral without consent.  Edward Matthews states he will talk to his father more about this and notify  us if patient is agreeable to additional services.    HISTORY OF PRESENT ILLNESS:  80 year old male with COPD.  Patient is being followed by Palliative Care monthly and PRN.  CODE STATUS: DNR ADVANCED DIRECTIVES: No MOST FORM: No PPS: 40%   PHYSICAL EXAM:   VITALS: Today's Vitals   05/14/21 0838  BP: 112/64  Resp: (!) 24  Temp: 98.2 F (36.8 C)  SpO2: 92%    LUNGS: Diminished bilaterally, no wheezes, rhonchi or rales present. + productive cough with clear sputum. CARDIAC: HRR EXTREMITIES: - edema SKIN: Warm and dry to touch.  No skin breakdown reported by patient.  NEURO: alert and oriented to person and place.       Lorenza Burton, RN

## 2021-05-14 NOTE — Progress Notes (Signed)
COMMUNITY PALLIATIVE CARE SW NOTE  PATIENT NAME: Edward Matthews DOB: 11-06-1941 MRN: [id number]  PRIMARY CARE PROVIDER: [name], MD  RESPONSIBLE PARTY:  Acct ID - Guarantor Home Phone Work Phone Relationship Acct Type  [id number] [name],* [phone]  Self P/F     [address] [place], [place] [address]     PLAN OF CARE and INTERVENTIONS:             1. GOALS OF CARE/ ADVANCE CARE PLANNING:  Patient is a DNR. Goal is to remain in the home.   2.         SOCIAL/EMOTIONAL/SPIRITUAL ASSESSMENT/ INTERVENTIONS:  SW and RN made follow up visit to patient home. Patient's son, Edward Matthews, was present as well for visit. Son and patient updated PC team on patients medical changes.  Patient laying bed at time of visit. Patient was alert during visit and able to answer questions appropriately. Son shares that patient seems to not be doing too good to him and is in bed majority of the day now. Patient shares that he feels weaker and is having more issues with breathing. Patient states that it feels as though he has fluid in his throat that he is constantly trying to swallow. Patients breathing is visibly more exacerbated while lying in bed. Patient is on 2LPM continuous.    Patient has declined physically and is only getting up to ambulate to bathroom. Patient has not showered in nearly 2 weeks, per son. Son has assisted with sponge bathes. Son makes meals for patient. Patient is able to clean dish in kitchen but breathing is exacerbated doing these types of ADL's. Patients appetite is fair and is consuming fluids throughout the day. No falls reported.   Son shares that patient is not sleeping at night due to breathing, even with increasing trazadone. Per son, patient is afraid of going to sleep at night due to being afraid of passing away in his sleep. Patient is fearful of being left alone.    RN checked vital signs and reviewed medications with patient and son. Patients continues to utilize  pill box. Son Edward Matthews is assisting with managing medications. Patient has been compliant with medications.   Patient is experiencing disease progression of COPD and has declined since last in person visit. Patient acknowledges his decline/change but does not believe is drastic. Patient is reluctant to additional in home services being arranged for him, but is aware that he can benefit from additional support. Patient has obsessive thoughts about insurance fraud and skeptical of services arranged by insurance.   Hospice services discussed and explained to patients son, separately. Son receptive to services. However, patient may not be, son sated that he will attempt to discuss services further with patient in hopes that he will be receptive to services as well. Palliative care will continue to follow.    3.         PATIENT/CAREGIVER EDUCATION/ COPING:  Patient A&O x3 this visit. Patients paranoia and anxiousness not present during visit. Restarting psych medications has improved these behaviors.    4.             PERSONAL EMERGENCY PLAN:  Patient will call 911 for emergencies   5.         COMMUNITY RESOURCES COORDINATION/ HEALTH CARE NAVIGATION: Patient and sons manages care.   6.                 FINANCIAL/LEGAL CONCERNS/INTERVENTIONS:  None  SOCIAL HX:  Social History   Tobacco Use  . Smoking status: Current Every Day Smoker    Packs/day: 1.50    Years: 63.00    Pack years: 94.50    Types: Cigarettes  . Smokeless tobacco: Never Used  Substance Use Topics  . Alcohol use: Yes    Comment: occasional    CODE STATUS: DNR  ADVANCED DIRECTIVES: N MOST FORM COMPLETE:  N HOSPICE EDUCATION PROVIDED: Y to son, Edward Matthews  PPS: patient is MIN A  With ADL's.      [name],

## 2021-05-18 ENCOUNTER — Telehealth: Payer: Self-pay

## 2021-05-18 NOTE — Telephone Encounter (Signed)
05/18/21 @11AM  : Palliative care SW returned patients son, Edward Matthews, [place]. Son shared that he and his brothers have talked about patients decline and feel that Hospice services are best appropriate for patient. Son also inquired about guardianship and HCPOA.  SW explained Hospice services and options vs SNF placement for patient as well as payers for both. SW discussed guardianship and HCPOA options as well. Patient has not been deemed incompetent, which would make it difficult for family to gain guardianship from this aspect. SW encouraged family to seek HCPOA.

## 2021-05-27 DIAGNOSIS — J449 Chronic obstructive pulmonary disease, unspecified: Secondary | ICD-10-CM | POA: Diagnosis not present

## 2021-05-31 ENCOUNTER — Other Ambulatory Visit: Payer: PPO

## 2021-05-31 ENCOUNTER — Telehealth: Payer: Self-pay

## 2021-05-31 ENCOUNTER — Other Ambulatory Visit: Payer: Self-pay

## 2021-05-31 VITALS — BP 122/58 | HR 64 | Temp 98.4°F | Resp 24 | Wt 136.0 lb

## 2021-05-31 DIAGNOSIS — Z515 Encounter for palliative care: Secondary | ICD-10-CM

## 2021-05-31 NOTE — Progress Notes (Signed)
COMMUNITY PALLIATIVE CARE SW NOTE  PATIENT NAME: Edward Matthews DOB: 1941-03-31 MRN: [id number]  PRIMARY CARE PROVIDER: [name], MD  RESPONSIBLE PARTY:  Acct ID - Guarantor Home Phone Work Phone Relationship Acct Type  [id number] Edward Matthews,* [phone]  Self P/F     [address], [place], [place] [address]     PLAN OF CARE and INTERVENTIONS:             1. GOALS OF CARE/ ADVANCE CARE PLANNING:  Patient is a DNR. Goal is to remain in the home.   2.         SOCIAL/EMOTIONAL/SPIRITUAL ASSESSMENT/ INTERVENTIONS:  SW and RN made follow up visit to patient home. Patient's son, Edward Matthews, was present as well for visit. Son and patient updated PC team on patients medical changes.   Patient laying in bed at time of visit. Patient was alert during visit and able to answer questions appropriately. Patient continue to be weaker and not getting out of bed as often. Patient is on 2LPM continuous.     Patient has declined physically and is only getting up to ambulate to bathroom. Patient shares that she has not had the energy to shower. Son makes meals for patient. Patient's appetite is fair and is consuming fluids throughout the day. Patient's wright has been stable. No falls reported.      RN checked vital signs and reviewed medications with patient and son. Patients continues to utilize pill box. Son Edward Matthews is assisting with managing medications. Patient has been compliant with medications. Patient could benefit from medication review for anxiety, as patients is more anxious when he is left alone.   Patient is experiencing disease progression of COPD and has been stable since last in person visit. Patient acknowledges his overall weakness. Patient continues to be reluctant to additional in home services being arranged for him, but is aware that he can benefit from additional support. Patient has obsessive thoughts about insurance fraud and skeptical of services arranged by insurance. Patient  could benefit from additional support such as PCS in the home, as patients sons are attempting to rotate care shifts with patient as this time to avoid missing work.    Palliative care will continue to follow and will attempt to complete visits bi-weekly.    3.         PATIENT/CAREGIVER EDUCATION/ COPING:  Patient A&O x3 this visit. Patient's paranoia and anxiousness not present during visit.    4.             PERSONAL EMERGENCY PLAN:  Patient will call 911 for emergencies   5.         COMMUNITY RESOURCES COORDINATION/ HEALTH CARE NAVIGATION: Patient and sons manages care.   6.                 FINANCIAL/LEGAL CONCERNS/INTERVENTIONS:  None       SOCIAL HX:  Social History   Tobacco Use  . Smoking status: Current Every Day Smoker    Packs/day: 1.50    Years: 63.00    Pack years: 94.50    Types: Cigarettes  . Smokeless tobacco: Never Used  Substance Use Topics  . Alcohol use: Yes    Comment: occasional    CODE STATUS: DNR ADVANCED DIRECTIVES: N MOST FORM COMPLETE:  N HOSPICE EDUCATION PROVIDED: Y with patients sons.  PPS: Patient is ambulatory w/o AD but has RW and SPC if needed. Patient is I with toileting. Patient is MIN-MOD A with  bathing and meal prep.   Time spent: 45 min       [place], [place]

## 2021-05-31 NOTE — Progress Notes (Signed)
PATIENT NAME: Edward Matthews DOB: 1941-01-03 MRN: [id number]  PRIMARY CARE PROVIDER: [name], MD  RESPONSIBLE PARTY:  Acct ID - Guarantor Home Phone Work Phone Relationship Acct Type  [id number] [name],* [phone]  Self P/F     [address], [place], [place] [address]    PLAN OF CARE and INTERVENTIONS:               1.  GOALS OF CARE/ ADVANCE CARE PLANNING:  Remain home under the care of his sons.                2.  PATIENT/CAREGIVER EDUCATION:  Additional home services.                4. PERSONAL EMERGENCY PLAN:  Avoid hospitalizations.                5.  DISEASE STATUS:  Joint visit completed with [place], SW, son Jacqulynn Cadet and patient.   Patient is found in his bed with lights off.  He is awake and verbal and allows the light to be turned on.  Patient is visibly short of breath and stated he did not feel up to a long visit.  Advised patient visit with would brief, as Palliative Care wanted to check in to see how he was doing. Patient states he has not been well and is experiencing an increase in weakness.  He has not showered in a couple of weeks.  Unshaven and unkempt in appearance.  Patient is eating most of his meals in the bed now. Sons are rotating shifts and making meals for patient. Appetite has remained good and current weight is 136 lbs.  Weight is down by 2 lbs since March.  BMI is 21.3. Last albumin 3.2 on 03/16/21.   He is only ambulating to the bathroom.  Remains continent of bowel and bladder.  Regular bm's reported almost daily.  Denies constipation issues.  Denies pain.  Discussed progression of disease and need for additional support.  Patient talked about his wife being under hospice care and passing at [facility].  I asked patient about having additional support in the home.  He wants to think more about this and talks about his distrust with insurance companies and likelihood he would be required to pay for such services.  Son reports periods of confusion and  insomnia over the weekend and last night.  Patient only slept for 3 hours last night.  He is mostly in the bed but will walk into the living room at times.  Only remains for short periods but returns to bed.  Son continues to encourage patient with hygiene and offers assistance as he is able to.  Son [name] is doing a pill box for patient and he remains compliant with medications.  Jeffery voices fatigue of him and his brothers in this new role of caregiving. They are hopeful to keep patient in the home but acknowledge more support is needed.   HISTORY OF PRESENT ILLNESS:  80 year old male with COPD.  Patient is being followed by Palliative Care monthly and PRN.   CODE STATUS: DNR  ADVANCED DIRECTIVES: No MOST FORM: No PPS: 40%   PHYSICAL EXAM:   VITALS: Today's Vitals   05/31/21 1335  BP: (!) 122/58  Pulse: 64  Resp: (!) 24  Temp: 98.4 F (36.9 C)  SpO2: 96%  Weight: 136 lb (61.7 kg)  PainSc: 0-No pain    LUNGS: diminished but CTA. No wheezes  present. CARDIAC: HRR EXTREMITIES: - for edema SKIN: warm and dry to touch.  No skin breakdown reported. NEURO: alert and oriented person, place and situation.       Lorenza Burton, RN

## 2021-05-31 NOTE — Telephone Encounter (Signed)
05/31/21 @3 :38PM: Palliative care SW outreached patients DSS case worker Arrie Aran Rayna Sexton [phone]) to inquire of any additional assistance for patient.   Call unsuccessful. SW left VM. Awaiting return call.

## 2021-06-06 ENCOUNTER — Emergency Department (HOSPITAL_COMMUNITY)
Admission: EM | Admit: 2021-06-06 | Discharge: 2021-06-07 | Disposition: A | Discharge status: Home / Self Care | Payer: PPO | Attending: Emergency Medicine | Admitting: Emergency Medicine

## 2021-06-06 ENCOUNTER — Encounter (HOSPITAL_COMMUNITY): Payer: Self-pay | Admitting: *Deleted

## 2021-06-06 ENCOUNTER — Other Ambulatory Visit: Payer: Self-pay

## 2021-06-06 DIAGNOSIS — S51812A Laceration without foreign body of left forearm, initial encounter: Secondary | ICD-10-CM | POA: Diagnosis not present

## 2021-06-06 DIAGNOSIS — F17210 Nicotine dependence, cigarettes, uncomplicated: Secondary | ICD-10-CM | POA: Diagnosis not present

## 2021-06-06 DIAGNOSIS — F0390 Unspecified dementia without behavioral disturbance: Secondary | ICD-10-CM | POA: Diagnosis not present

## 2021-06-06 DIAGNOSIS — S0990XA Unspecified injury of head, initial encounter: Secondary | ICD-10-CM | POA: Insufficient documentation

## 2021-06-06 DIAGNOSIS — T148XXA Other injury of unspecified body region, initial encounter: Secondary | ICD-10-CM

## 2021-06-06 DIAGNOSIS — S71112A Laceration without foreign body, left thigh, initial encounter: Secondary | ICD-10-CM | POA: Diagnosis not present

## 2021-06-06 DIAGNOSIS — M545 Low back pain, unspecified: Secondary | ICD-10-CM | POA: Diagnosis not present

## 2021-06-06 DIAGNOSIS — E039 Hypothyroidism, unspecified: Secondary | ICD-10-CM | POA: Diagnosis not present

## 2021-06-06 DIAGNOSIS — S0012XA Contusion of left eyelid and periocular area, initial encounter: Secondary | ICD-10-CM | POA: Insufficient documentation

## 2021-06-06 DIAGNOSIS — S79922A Unspecified injury of left thigh, initial encounter: Secondary | ICD-10-CM | POA: Diagnosis present

## 2021-06-06 DIAGNOSIS — Z043 Encounter for examination and observation following other accident: Secondary | ICD-10-CM | POA: Diagnosis not present

## 2021-06-06 DIAGNOSIS — W19XXXA Unspecified fall, initial encounter: Secondary | ICD-10-CM

## 2021-06-06 DIAGNOSIS — W06XXXA Fall from bed, initial encounter: Secondary | ICD-10-CM | POA: Diagnosis not present

## 2021-06-06 DIAGNOSIS — Z7951 Long term (current) use of inhaled steroids: Secondary | ICD-10-CM | POA: Insufficient documentation

## 2021-06-06 DIAGNOSIS — J441 Chronic obstructive pulmonary disease with (acute) exacerbation: Secondary | ICD-10-CM | POA: Diagnosis not present

## 2021-06-06 DIAGNOSIS — Z7982 Long term (current) use of aspirin: Secondary | ICD-10-CM | POA: Insufficient documentation

## 2021-06-06 DIAGNOSIS — M25551 Pain in right hip: Secondary | ICD-10-CM | POA: Diagnosis not present

## 2021-06-06 DIAGNOSIS — M25552 Pain in left hip: Secondary | ICD-10-CM | POA: Diagnosis not present

## 2021-06-06 NOTE — ED Triage Notes (Signed)
Thwe pt fell out of his bed either last night or today   he has abrasions to his face and head and an abrasob to his rt arm  on home 02 copd

## 2021-06-06 NOTE — ED Notes (Signed)
Placed patient on hospital oxygen tank

## 2021-06-07 ENCOUNTER — Emergency Department (HOSPITAL_COMMUNITY): Payer: PPO

## 2021-06-07 ENCOUNTER — Telehealth: Payer: Self-pay

## 2021-06-07 DIAGNOSIS — W19XXXA Unspecified fall, initial encounter: Secondary | ICD-10-CM | POA: Diagnosis not present

## 2021-06-07 DIAGNOSIS — R5381 Other malaise: Secondary | ICD-10-CM | POA: Diagnosis not present

## 2021-06-07 DIAGNOSIS — M25552 Pain in left hip: Secondary | ICD-10-CM | POA: Diagnosis not present

## 2021-06-07 DIAGNOSIS — Z7401 Bed confinement status: Secondary | ICD-10-CM | POA: Diagnosis not present

## 2021-06-07 DIAGNOSIS — M25551 Pain in right hip: Secondary | ICD-10-CM | POA: Diagnosis not present

## 2021-06-07 DIAGNOSIS — M2550 Pain in unspecified joint: Secondary | ICD-10-CM | POA: Diagnosis not present

## 2021-06-07 DIAGNOSIS — S71112A Laceration without foreign body, left thigh, initial encounter: Secondary | ICD-10-CM | POA: Diagnosis not present

## 2021-06-07 DIAGNOSIS — M545 Low back pain, unspecified: Secondary | ICD-10-CM | POA: Diagnosis not present

## 2021-06-07 DIAGNOSIS — Z043 Encounter for examination and observation following other accident: Secondary | ICD-10-CM | POA: Diagnosis not present

## 2021-06-07 NOTE — ED Notes (Signed)
Patient oxygen tank replaced

## 2021-06-07 NOTE — ED Notes (Signed)
[facility] here to transport patient home.

## 2021-06-07 NOTE — Telephone Encounter (Signed)
2 pm.  Message received from [place] with Dr. Theda Sers.  Okay for hospice referral and Dr. Theda Sers will continue to serve as attending of record for hospice.   Referral Ctr is update on referral.

## 2021-06-07 NOTE — Discharge Instructions (Addendum)
Trays of the back both hips and pelvis and CT head neck and face without any acute findings.  The wounds are mostly skin tears.  Apply antibiotic ointment to the back of the left ear wound twice a day.  For the right wrist skin tear apply antibiotic ointment daily with a fresh dressing.  Wash both areas with soap and water daily.  Contact his primary care doctor Dr. [name] for consideration for additional assistance at home.  Return for any new or worse symptoms.

## 2021-06-07 NOTE — ED Provider Notes (Signed)
[facility] EMERGENCY DEPARTMENT Provider Note   CSN: [id number] Arrival date & time: 06/06/21  1940     History Chief Complaint  Patient presents with   Alekxander Isola is a 80 y.o. male.  Patient brought in by family member.  He is taking care of by his 2 sons.  They do not live in the house.  Patient has a history of dementia with some behavioral disturbances.  As well as COPD.  Patient was brought in last evening underwent medical screening.  Patient reportedly fell out of his bed last night and today.  Has abrasions to his face and head patient is normally on 2 L of oxygen.  Patient this morning complaining of back pain and bilateral hip pain right greater than left.  Patient with wound to the back of the left ear and to his right wrist area.  Patient not on any significant blood thinners.      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   Paranoia ([place])    Tobacco abuse    Chest pain 01/17/2018   COPD (chronic obstructive pulmonary disease) ([place]) 01/17/2018   Chronic respiratory failure with hypoxia ([place]) 01/17/2018    Past Surgical History:  Procedure Laterality Date   COLONOSCOPY WITH PROPOFOL N/A 07/28/2017   Procedure: COLONOSCOPY WITH PROPOFOL;  Surgeon: Carol Ada, MD;  Location: [facility] END[facility]COPY;  Service: Endoscopy;  Laterality: N/A;   ESOPHAGOGASTRODUODENOSCOPY (EGD) WITH PROPOFOL N/A 07/28/2017   Procedure: ESOPHAGOGASTRODUODENOSCOPY (EGD) WITH PROPOFOL;   Surgeon: Carol Ada, MD;  Location: [facility] ENDOS[facility];  Service: Endoscopy;  Laterality: N/A;   TONSILLECTOMY         Family History  Problem Relation Age of Onset   Hypertension Father     Social History   Tobacco Use   Smoking status: Every Day    Packs/day: 1.50    Years: 63.00    Pack years: 94.50    Types: Cigarettes   Smokeless tobacco: Never  Substance Use Topics   Alcohol use: Yes    Comment: occasional   Drug use: Not Currently    Home Medications Prior to Admission medications   Medication Sig Start Date End Date Taking? Authorizing Provider  ADVAIR DISKUS 250-50 MCG/DOSE AEPB Inhale 2 puffs into the lungs 2 (two) times daily.  05/30/17   [provider]  albuterol (VENTOLIN HFA) 108 (90 Base) MCG/ACT inhaler Inhale 1 puff into the lungs every 4 (four) hours as needed for wheezing or shortness of breath.    [provider]  aspirin 81 MG chewable tablet Chew 81 mg by mouth daily. 08/15/18   [provider]  dexlansoprazole (DEXILANT) 60 MG capsule Take 60 mg by mouth daily as needed (acid reflux).    [provider]  fluticasone (FLONASE) 50 MCG/ACT nasal spray Place 1 spray into both nostrils daily. 03/13/21   Barb Merino, MD  guaiFENesin (MUCINEX) 600 MG 12 hr tablet Take 1 tablet (600 mg total) by mouth 2 (two) times daily as needed  for cough or to loosen phlegm. 03/03/21   Aline August, MD  hydrOXYzine (ATARAX/VISTARIL) 10 MG tablet Take 1 tablet (10 mg total) by mouth 3 (three) times daily as needed for anxiety. 03/13/21   Barb Merino, MD  levothyroxine (SYNTHROID) 125 MCG tablet Take 1 tablet (125 mcg total) by mouth daily before breakfast. 03/13/21 04/12/21  Barb Merino, MD  predniSONE (DELTASONE) 10 MG tablet 4 tabs for 2 days, 3 tabs for 2 days, 2 tabs for 2 days, 1 tabs for 2 days Patient not taking: Reported on 05/14/2021 03/13/21   Barb Merino, MD  traZODone (DESYREL) 100 MG tablet Take 1 tablet (100 mg total) by mouth  at bedtime. 03/13/21 04/12/21  Barb Merino, MD    Allergies    Chantix [varenicline]  Review of Systems   Review of Systems  Constitutional:  Negative for chills and fever.  HENT:  Negative for ear pain and sore throat.   Eyes:  Negative for pain and visual disturbance.  Respiratory:  Negative for cough and shortness of breath.   Cardiovascular:  Negative for chest pain and palpitations.  Gastrointestinal:  Negative for abdominal pain and vomiting.  Genitourinary:  Negative for dysuria and hematuria.  Musculoskeletal:  Negative for arthralgias and back pain.  Skin:  Positive for wound. Negative for color change and rash.  Neurological:  Negative for seizures and syncope.  Hematological:  Does not bruise/bleed easily.  All other systems reviewed and are negative.  Physical Exam Updated Vital Signs BP (!) 148/82   Pulse (!) 57   Temp 97.6 F (36.4 C) (Oral)   Resp 18   Ht 1.753 m (5\' 9" )   Wt 61.7 kg   SpO2 95%   BMI 20.09 kg/m   Physical Exam Vitals and nursing note reviewed.  Constitutional:      General: He is not in acute distress.    Appearance: He is well-developed.     Comments: Patient has 2 L of oxygen on.  Oxygen sats are in the low 90 percentile range  HENT:     Head: Normocephalic and atraumatic.     Comments: Patient with a superficial laceration to the lateral aspect of the left thigh.  No active bleeding.  So behind the left pinna there is a angular shaped skin avulsion.  Not deep.  Both measure approximately 1 x 1.5 cm.  There is some ecchymosis to the lower eyelid related to the lateral skin tear of the orbit area. Eyes:     Extraocular Movements: Extraocular movements intact.     Conjunctiva/sclera: Conjunctivae normal.     Pupils: Pupils are equal, round, and reactive to light.     Comments: Left eye without any hyphema no scleral hematoma extraocular muscles are intact.  Cardiovascular:     Rate and Rhythm: Normal rate and regular rhythm.     Heart  sounds: No murmur heard. Pulmonary:     Effort: Pulmonary effort is normal. No respiratory distress.     Breath sounds: Normal breath sounds.     Comments: Rare wheezing Abdominal:     Palpations: Abdomen is soft.     Tenderness: There is no abdominal tenderness.  Musculoskeletal:     Cervical back: Neck supple.     Comments: Right forearm area with a 2 to 3 cm superficial laceration almost skin tear.  Not deep through the soft tissues.  Radial pulse is 2+.  Good movement of fingers.  Sensation intact.  Good cap refill.  Good movement  at the elbow shoulder.  Bilateral lower extremities with some discomfort in both hips with full range of motion.  Right greater than left.  Skin:    General: Skin is warm and dry.     Capillary Refill: Capillary refill takes less than 2 seconds.  Neurological:     General: No focal deficit present.     Mental Status: He is alert. Mental status is at baseline.     Cranial Nerves: No cranial nerve deficit.     Sensory: No sensory deficit.     Motor: No weakness.    ED Results / Procedures / Treatments   Labs (all labs ordered are listed, but only abnormal results are displayed) Labs Reviewed - No data to display  EKG None  Radiology DG Lumbar Spine Complete  Result Date: 06/07/2021 CLINICAL DATA:  Fall with low back pain EXAM: LUMBAR SPINE - COMPLETE 4+ VIEW COMPARISON:  None. FINDINGS: No visible acute fracture. Limiting generalized osteopenia. Lower lumbar facet spurring with mild L5-S1 anterolisthesis. Preserved disc height diffusely. Atheromatous calcification of the aorta and iliacs. IMPRESSION: No acute finding. Electronically Signed   By: Monte Fantasia M.D.   On: 06/07/2021 05:56   CT Head Wo Contrast  Result Date: 06/07/2021 CLINICAL DATA:  Fall from bed EXAM: CT HEAD WITHOUT CONTRAST CT MAXILLOFACIAL WITHOUT CONTRAST CT CERVICAL SPINE WITHOUT CONTRAST TECHNIQUE: Multidetector CT imaging of the head, cervical spine, and maxillofacial  structures were performed using the standard protocol without intravenous contrast. Multiplanar CT image reconstructions of the cervical spine and maxillofacial structures were also generated. COMPARISON:  None. FINDINGS: CT HEAD FINDINGS Brain: No evidence of acute infarction, hemorrhage, hydrocephalus, extra-axial collection or mass lesion/mass effect. Chronic small vessel ischemia in the cerebral white matter. Mild cerebral volume loss. Vascular: No hyperdense vessel or unexpected calcification. Skull: Negative for fracture CT MAXILLOFACIAL FINDINGS Osseous: No fracture or mandibular dislocation.  Dental caries. Orbits: No evidence of injury Sinuses: Negative for hemosinus. Right mastoid opacification which is new from March 2022 neck CT. Soft tissues: No opaque foreign body. Mild swelling seen over the left face. CT CERVICAL SPINE FINDINGS Alignment: Normal. Skull base and vertebrae: No acute fracture Soft tissues and spinal canal: No prevertebral fluid or swelling. No visible canal hematoma. Disc levels:  Ordinary and mild for age degenerative change. Upper chest: Apical emphysema IMPRESSION: 1. No evidence of acute intracranial or cervical spine injury. 2. Negative for facial fracture. 3. Partial right mastoid opacification which is new from CT March 2022. Electronically Signed   By: Monte Fantasia M.D.   On: 06/07/2021 05:42   CT Cervical Spine Wo Contrast  Result Date: 06/07/2021 CLINICAL DATA:  Fall from bed EXAM: CT HEAD WITHOUT CONTRAST CT MAXILLOFACIAL WITHOUT CONTRAST CT CERVICAL SPINE WITHOUT CONTRAST TECHNIQUE: Multidetector CT imaging of the head, cervical spine, and maxillofacial structures were performed using the standard protocol without intravenous contrast. Multiplanar CT image reconstructions of the cervical spine and maxillofacial structures were also generated. COMPARISON:  None. FINDINGS: CT HEAD FINDINGS Brain: No evidence of acute infarction, hemorrhage, hydrocephalus, extra-axial  collection or mass lesion/mass effect. Chronic small vessel ischemia in the cerebral white matter. Mild cerebral volume loss. Vascular: No hyperdense vessel or unexpected calcification. Skull: Negative for fracture CT MAXILLOFACIAL FINDINGS Osseous: No fracture or mandibular dislocation.  Dental caries. Orbits: No evidence of injury Sinuses: Negative for hemosinus. Right mastoid opacification which is new from March 2022 neck CT. Soft tissues: No opaque foreign body. Mild swelling seen over the left face.  CT CERVICAL SPINE FINDINGS Alignment: Normal. Skull base and vertebrae: No acute fracture Soft tissues and spinal canal: No prevertebral fluid or swelling. No visible canal hematoma. Disc levels:  Ordinary and mild for age degenerative change. Upper chest: Apical emphysema IMPRESSION: 1. No evidence of acute intracranial or cervical spine injury. 2. Negative for facial fracture. 3. Partial right mastoid opacification which is new from CT March 2022. Electronically Signed   By: Monte Fantasia M.D.   On: 06/07/2021 05:42   DG HIP UNILAT WITH PELVIS 2-3 VIEWS LEFT  Result Date: 06/07/2021 CLINICAL DATA:  Fall with right greater than left hip pain EXAM: DG HIP (WITH OR WITHOUT PELVIS) 2-3V LEFT COMPARISON:  Concurrently obtained radiographs of the right hip FINDINGS: There is no evidence of hip fracture or dislocation. There is no evidence of arthropathy or other focal bone abnormality. IMPRESSION: Negative. Electronically Signed   By: Jacqulynn Cadet M.D.   On: 06/07/2021 09:05   DG HIP UNILAT WITH PELVIS 2-3 VIEWS RIGHT  Result Date: 06/07/2021 CLINICAL DATA:  Fall with right worse than left hip pain EXAM: DG HIP (WITH OR WITHOUT PELVIS) 2-3V RIGHT COMPARISON:  Concurrently obtained radiographs of the left hip FINDINGS: There is no evidence of hip fracture or dislocation. Sclerotic focus in the intertrochanteric portion of the femur has a benign imaging appearance likely representing a small bone island.  There is no evidence of arthropathy or other focal bone abnormality. IMPRESSION: Negative. Electronically Signed   By: Jacqulynn Cadet M.D.   On: 06/07/2021 09:03   CT Maxillofacial WO CM  Result Date: 06/07/2021 CLINICAL DATA:  Fall from bed EXAM: CT HEAD WITHOUT CONTRAST CT MAXILLOFACIAL WITHOUT CONTRAST CT CERVICAL SPINE WITHOUT CONTRAST TECHNIQUE: Multidetector CT imaging of the head, cervical spine, and maxillofacial structures were performed using the standard protocol without intravenous contrast. Multiplanar CT image reconstructions of the cervical spine and maxillofacial structures were also generated. COMPARISON:  None. FINDINGS: CT HEAD FINDINGS Brain: No evidence of acute infarction, hemorrhage, hydrocephalus, extra-axial collection or mass lesion/mass effect. Chronic small vessel ischemia in the cerebral white matter. Mild cerebral volume loss. Vascular: No hyperdense vessel or unexpected calcification. Skull: Negative for fracture CT MAXILLOFACIAL FINDINGS Osseous: No fracture or mandibular dislocation.  Dental caries. Orbits: No evidence of injury Sinuses: Negative for hemosinus. Right mastoid opacification which is new from March 2022 neck CT. Soft tissues: No opaque foreign body. Mild swelling seen over the left face. CT CERVICAL SPINE FINDINGS Alignment: Normal. Skull base and vertebrae: No acute fracture Soft tissues and spinal canal: No prevertebral fluid or swelling. No visible canal hematoma. Disc levels:  Ordinary and mild for age degenerative change. Upper chest: Apical emphysema IMPRESSION: 1. No evidence of acute intracranial or cervical spine injury. 2. Negative for facial fracture. 3. Partial right mastoid opacification which is new from CT March 2022. Electronically Signed   By: Monte Fantasia M.D.   On: 06/07/2021 05:42    Procedures Procedures   Medications Ordered in ED Medications - No data to display  ED Course  I have reviewed the triage vital signs and the nursing  notes.  Pertinent labs & imaging results that were available during my care of the patient were reviewed by me and considered in my medical decision making (see chart for details).    MDM Rules/Calculators/A&P                          Patient CT head neck maxillofacial  without any acute bony abnormalities.  X-rays of the lumbar spine and then x-rays of both hips and pelvis without any acute bony abnormalities.  Patient has the superficial laceration to the right wrist area.  But no tenderness or deformity there no concern about fracture there.  Patient's wounds can heal by secondary intention.  Wound care to the wrist wound and to the wound to the behind the ear.  The wound to the lateral aspect of the left is already sealed.  No specific care needed for that at this time.  Patient stable for discharge home. Final Clinical Impression(s) / ED Diagnoses Final diagnoses:  Fall, initial encounter  Injury of head, initial encounter  Multiple skin tears    Rx / DC Orders ED Discharge Orders     None        Fredia Sorrow, MD 06/07/21 1028

## 2021-06-07 NOTE — ED Notes (Signed)
Wound care and dressing applied to right wrist

## 2021-06-07 NOTE — ED Provider Notes (Signed)
Emergency Medicine Provider Triage Evaluation Note  Edward Matthews , a 80 y.o. male  was evaluated in triage.  Pt complains of fall. [place] out of bed, head trauma, unknown LOC. Pain to face and lower back. Not on blood thinners.   Review of Systems  Positive: Back pain, headache Negative: Chest pain, abdominal pain  Physical Exam  BP 121/63 (BP Location: Left Arm)   Pulse (!) 55   Temp 97.6 F (36.4 C) (Oral)   Resp (!) 22   Ht 5\' 9"  (1.753 m)   Wt 61.7 kg   SpO2 94%   BMI 20.09 kg/m  Gen:   Awake, no distress  Resp:  Normal effort  MSK:   Moves extremities without difficulty Other:  L periorbital ecchymosis  & tenderness laterally. Abrasion to left ear. L spine tenderness midline. No chest/abdominal tendenress.  Medical Decision Making  Medically screening exam initiated at 4:59 AM.  Appropriate orders placed.  Edward Matthews was informed that the remainder of the evaluation will be completed by another provider, this initial triage assessment does not replace that evaluation, and the importance of remaining in the ED until their evaluation is complete.  [address] [place] 06/07/21 0501    Orpah Greek, MD 06/07/21 [phone]

## 2021-06-07 NOTE — Telephone Encounter (Signed)
320 pm.  Phone call made to [facility] regarding hospice eligibility.  [name] confirms they would like to proceed with a hospice admission.  John would like to be primary contact with [name] as secondary.

## 2021-06-07 NOTE — ED Notes (Signed)
ptar called 

## 2021-06-07 NOTE — Telephone Encounter (Signed)
1230 pm.  Phone call made to PCP office to request a hospice referral and see if PCP would serve as the attending of record while patient is under hospice care.  Message left with [facility] regarding above.  Response pending.   Per Rojelio Brenner, RN hospital liaison patient has discharged from the ED and family/patient are now onboard with hospice referral.  Case has already been reviewed with Dr. Cleon Gustin and patient is eligible for services.

## 2021-06-26 DIAGNOSIS — J449 Chronic obstructive pulmonary disease, unspecified: Secondary | ICD-10-CM | POA: Diagnosis not present

## 2021-07-15 DIAGNOSIS — J449 Chronic obstructive pulmonary disease, unspecified: Secondary | ICD-10-CM | POA: Diagnosis not present

## 2022-04-24 ENCOUNTER — Encounter (HOSPITAL_COMMUNITY): Payer: Self-pay

## 2022-04-24 ENCOUNTER — Emergency Department (HOSPITAL_COMMUNITY)
Admission: EM | Admit: 2022-04-24 | Discharge: 2022-04-24 | Disposition: A | Discharge status: Skilled Nursing Facility | Payer: PPO | Attending: Emergency Medicine | Admitting: Emergency Medicine

## 2022-04-24 ENCOUNTER — Emergency Department (HOSPITAL_COMMUNITY): Payer: PPO

## 2022-04-24 DIAGNOSIS — S51012A Laceration without foreign body of left elbow, initial encounter: Secondary | ICD-10-CM | POA: Insufficient documentation

## 2022-04-24 DIAGNOSIS — Z515 Encounter for palliative care: Secondary | ICD-10-CM | POA: Insufficient documentation

## 2022-04-24 DIAGNOSIS — S59902A Unspecified injury of left elbow, initial encounter: Secondary | ICD-10-CM | POA: Diagnosis present

## 2022-04-24 DIAGNOSIS — Z7982 Long term (current) use of aspirin: Secondary | ICD-10-CM | POA: Insufficient documentation

## 2022-04-24 DIAGNOSIS — Z79899 Other long term (current) drug therapy: Secondary | ICD-10-CM | POA: Insufficient documentation

## 2022-04-24 DIAGNOSIS — R410 Disorientation, unspecified: Secondary | ICD-10-CM | POA: Insufficient documentation

## 2022-04-24 DIAGNOSIS — W19XXXA Unspecified fall, initial encounter: Secondary | ICD-10-CM | POA: Insufficient documentation

## 2022-04-24 NOTE — ED Triage Notes (Signed)
Pt coming from [facility] with [facility]. Pt got up this morning and fell on a slick surface. Not on blood thinners. Pt denies pain. Small skin tear on left elbow. Pt ambulates with a walker. Facility reports pt is confused at baseline. Facility reports pt is a hospice pt but does not have a DNR. 10 mg morphine PTA ?

## 2022-04-24 NOTE — Discharge Instructions (Signed)
Follow-up with primary care doctor.  Apply gauze dressing to the skin tear to the left elbow and keep clean.  Continue hospice services.  Continue prior medications. ?

## 2022-04-24 NOTE — Progress Notes (Signed)
Manufacturing engineer [facility]) Hospital Liaison note.   ? ?This is a current [facility] hospice patient with a terminal diagnosis of COPD.  ? ?Per [facility] records this patient is a DNR.   ? ?[place] liaison will follow for disposition and coordination of care.  ? ?Please do not hesitate to call with questions.   ?Thank you,   ?Farrel Gordon, RN, CCM      ?[facility] Hospital Liaison   ?[phone] ?

## 2022-04-24 NOTE — ED Provider Notes (Signed)
[facility] EMERGENCY DEPARTMENT Provider Note   CSN: [id number] Arrival date & time: 04/24/22  0719     History  Chief Complaint  Patient presents with   Edward Matthews    Edward Matthews is a 81 y.o. male.  From [facility].  On hospice.  EMS report witnessed fall on slick surface.  Patient not on blood thinner.  Patient has baseline confusion per their report.  Patient is not able to describe the events from earlier today.  He does not have any ongoing pain at present.  HPI     Home Medications Prior to Admission medications   Medication Sig Start Date End Date Taking? Authorizing Provider  ADVAIR DISKUS 250-50 MCG/DOSE AEPB Inhale 2 puffs into the lungs 2 (two) times daily.  05/30/17   [provider]  albuterol (VENTOLIN HFA) 108 (90 Base) MCG/ACT inhaler Inhale 1 puff into the lungs every 4 (four) hours as needed for wheezing or shortness of breath.    [provider]  aspirin 81 MG chewable tablet Chew 81 mg by mouth daily. 08/15/18   [provider]  dexlansoprazole (DEXILANT) 60 MG capsule Take 60 mg by mouth daily as needed (acid reflux).    [provider]  fluticasone (FLONASE) 50 MCG/ACT nasal spray Place 1 spray into both nostrils daily. 03/13/21   Dorcas Carrow, MD  guaiFENesin (MUCINEX) 600 MG 12 hr tablet Take 1 tablet (600 mg total) by mouth 2 (two) times daily as needed for cough or to loosen phlegm. 03/03/21   Glade Lloyd, MD  hydrOXYzine (ATARAX/VISTARIL) 10 MG tablet Take 1 tablet (10 mg total) by mouth 3 (three) times daily as needed for anxiety. 03/13/21   Dorcas Carrow, MD  levothyroxine (SYNTHROID) 125 MCG tablet Take 1 tablet (125 mcg total) by mouth daily before breakfast. 03/13/21 04/12/21  Dorcas Carrow, MD  predniSONE (DELTASONE) 10 MG tablet 4 tabs for 2 days, 3 tabs for 2 days, 2 tabs for 2 days, 1 tabs for 2 days Patient not taking: Reported on 05/14/2021 03/13/21   Dorcas Carrow, MD  traZODone (DESYREL)  100 MG tablet Take 1 tablet (100 mg total) by mouth at bedtime. 03/13/21 04/12/21  Dorcas Carrow, MD      Allergies    Chantix [varenicline]    Review of Systems   Review of Systems  Unable to perform ROS: Dementia   Physical Exam Updated Vital Signs BP (!) 157/102   Pulse 97   Temp 97.7 F (36.5 C) (Oral)   Resp (!) 35   Ht 5\' 9"  (1.753 m)   Wt 61.7 kg   SpO2 92%   BMI 20.09 kg/m  Physical Exam Vitals and nursing note reviewed.  Constitutional:      General: He is not in acute distress.    Appearance: He is well-developed.  HENT:     Head: Normocephalic and atraumatic.  Eyes:     Conjunctiva/sclera: Conjunctivae normal.  Cardiovascular:     Rate and Rhythm: Normal rate and regular rhythm.     Heart sounds: No murmur heard. Pulmonary:     Effort: Pulmonary effort is normal. No respiratory distress.     Breath sounds: Normal breath sounds.  Abdominal:     Palpations: Abdomen is soft.     Tenderness: There is no abdominal tenderness.  Musculoskeletal:     Cervical back: Neck supple.     Comments: 1 cm superficial skin tear to the left elbow  Skin:    General:  Skin is warm and dry.     Capillary Refill: Capillary refill takes less than 2 seconds.  Neurological:     Mental Status: He is alert.  Psychiatric:        Mood and Affect: Mood normal.    ED Results / Procedures / Treatments   Labs (all labs ordered are listed, but only abnormal results are displayed) Labs Reviewed - No data to display  EKG None  Radiology DG Elbow 2 Views Left  Result Date: 04/24/2022 CLINICAL DATA:  LEFT elbow pain following fall. EXAM: LEFT ELBOW - 2 VIEW COMPARISON:  None FINDINGS: There is no evidence of fracture, dislocation, or joint effusion. There is no evidence of arthropathy or other focal bone abnormality. Soft tissues are unremarkable. IMPRESSION: Negative. Electronically Signed   By: Donzetta Kohut M.D.   On: 04/24/2022 09:06   CT Head Wo Contrast  Result Date:  04/24/2022 CLINICAL DATA:  81 year old male status post fall. EXAM: CT HEAD WITHOUT CONTRAST TECHNIQUE: Contiguous axial images were obtained from the base of the skull through the vertex without intravenous contrast. RADIATION DOSE REDUCTION: This exam was performed according to the departmental dose-optimization program which includes automated exposure control, adjustment of the mA and/or kV according to patient size and/or use of iterative reconstruction technique. COMPARISON:  Head CT 06/07/2021. FINDINGS: Brain: Stable cerebral volume. Patchy and confluent bilateral cerebral white matter density does not appear significantly changed. Deep white matter capsule involvement as before. No midline shift, ventriculomegaly, mass effect, evidence of mass lesion, intracranial hemorrhage or evidence of cortically based acute infarction. Vascular: Calcified atherosclerosis at the skull base. No suspicious intracranial vascular hyperdensity. Skull: No acute osseous abnormality identified. Sinuses/Orbits: Visualized paranasal sinuses and mastoids are clear. But right mastoid air cell and tympanic cavity opacification have progressed since last year, now completely opacified. No strong evidence of ossicle or mastoid erosion. Left middle ear and mastoids remain clear. Other: Mildly asymmetric increased nasopharynx soft tissue on the right (series 4, image 1). However, on cervical spine CT today there is no strong evidence of neck mass or lymphadenopathy. Visualized orbits and scalp soft tissues are within normal limits. IMPRESSION: 1. No acute traumatic injury identified. 2. Stable non contrast CT appearance of the brain since last year. Chronic white matter disease. 3. Evidence of progressive right eustachian tube dysfunction since last year. No obvious nasopharyngeal mass (although Neck CT with IV contrast would be most sensitive). Query Otitis media. Follow-up with ENT recommended. Electronically Signed   By: Odessa Fleming M.D.    On: 04/24/2022 09:17   CT Cervical Spine Wo Contrast  Result Date: 04/24/2022 CLINICAL DATA:  81 year old male status post fall. EXAM: CT CERVICAL SPINE WITHOUT CONTRAST TECHNIQUE: Multidetector CT imaging of the cervical spine was performed without intravenous contrast. Multiplanar CT image reconstructions were also generated. RADIATION DOSE REDUCTION: This exam was performed according to the departmental dose-optimization program which includes automated exposure control, adjustment of the mA and/or kV according to patient size and/or use of iterative reconstruction technique. COMPARISON:  Cervical spine CT 06/07/2021. Head CT today. FINDINGS: Alignment: Chronic straightening of cervical lordosis has not significantly changed. Cervicothoracic junction alignment is within normal limits. Bilateral posterior element alignment is within normal limits. Skull base and vertebrae: Visualized skull base is intact. No atlanto-occipital dissociation. C1 and C2 appear intact and aligned. No acute osseous abnormality identified. Soft tissues and spinal canal: No prevertebral fluid or swelling. No visible canal hematoma. Small volume retained secretions in the nasopharynx similar to  the CT last year. Right middle ear and mastoid opacification has progressed, see Head CT today. Mild motion artifact throughout the neck soft tissues. Calcified carotid atherosclerosis with no strong evidence of neck mass or lymphadenopathy. Disc levels: Widespread but generally mild for age cervical spine degeneration. No definite spinal stenosis. Upper chest: Grossly intact visible upper thoracic levels. Centrilobular emphysema. IMPRESSION: 1. No acute traumatic injury identified in the cervical spine. 2. Emphysema (ICD10-J43.9). 3. Right middle ear and mastoid opacification progressed from last year, see Head CT reported separately. Electronically Signed   By: Odessa Fleming M.D.   On: 04/24/2022 09:20   DG Pelvis Portable  Result Date:  04/24/2022 CLINICAL DATA:  An 81 year old male presents for value to a shin of pelvic pain following fall. EXAM: PORTABLE PELVIS 1-2 VIEWS COMPARISON:  Hip evaluation from January 07, 2021. FINDINGS: AP pelvis acquired portably with EKG leads projecting over the LEFT pelvis and hip. No sign of acute pelvic fracture. Stool and gas limiting dedicated assessment of the sacrum. Soft tissues are unremarkable. LEFT hip partially obscured by EKG leads. IMPRESSION: No sign of acute pelvic fracture. Limited exam due to portable technique in EKG leads overlying the LEFT hip. If there are symptoms that would suggest injury to the LEFT hip would suggest dedicated evaluation of the LEFT hip after clearance of EKG leads. Electronically Signed   By: Donzetta Kohut M.D.   On: 04/24/2022 09:09   DG Chest Portable 1 View  Result Date: 04/24/2022 CLINICAL DATA:  chest pain, elbow pain after fall. EXAM: PORTABLE CHEST 1 VIEW COMPARISON:  March 17, 2019. FINDINGS: EKG leads project over the chest. Trachea is midline. Cardiomediastinal contours and hilar structures are stable. Lungs are clear with signs of hyperinflation as before. No gross effusion. No visible pneumothorax. On limited assessment there is no acute skeletal process. IMPRESSION: Question COPD without acute cardiopulmonary disease. Coverage excludes a small portion of the RIGHT lateral chest wall and costodiaphragmatic sulcus. Consider repeat imaging based on clinical symptoms. Electronically Signed   By: Donzetta Kohut M.D.   On: 04/24/2022 09:05    Procedures Procedures    Medications Ordered in ED Medications - No data to display  ED Course/ Medical Decision Making/ A&P                           Medical Decision Making Amount and/or Complexity of Data Reviewed Radiology: ordered.   81 year old male presenting to the ER from [facility], reportedly on hospice care due to concern for fall.  On physical exam note superficial skin tear to left elbow but  no other traumatic findings noted.  Checked CT head, C-spine, no acute traumatic pathology identified.  Elbow x-ray negative.  Screening chest and pelvis x-rays negative for any definite acute findings.  Vital signs are stable.  Feel patient is stable for discharge back to his skilled nursing facility to continue hospice care there.  I attempted to call family, son Jonny Ruiz but no answer.  Will go PTAR.           Final Clinical Impression(s) / ED Diagnoses Final diagnoses:  Fall, initial encounter  Hospice care patient    Rx / DC Orders ED Discharge Orders     None         Milagros Loll, MD 04/24/22 1110

## 2022-04-24 NOTE — ED Notes (Signed)
Unable to reach [facility] staff  ?

## 2022-04-24 NOTE — ED Notes (Signed)
PTAR called  

## 2022-04-24 NOTE — ED Notes (Signed)
Patient transported to CT 

## 2022-05-26 DEATH — deceased
